# Patient Record
Sex: Female | Born: 1975
Health system: Southern US, Community
[De-identification: ages and names within clinical notes are randomized; demographics above are authoritative.]

## PROBLEM LIST (undated history)

## (undated) DIAGNOSIS — I10 Essential (primary) hypertension: Secondary | ICD-10-CM

## (undated) DIAGNOSIS — J45909 Unspecified asthma, uncomplicated: Secondary | ICD-10-CM

## (undated) DIAGNOSIS — E119 Type 2 diabetes mellitus without complications: Secondary | ICD-10-CM

---

## 2015-12-01 ENCOUNTER — Emergency Department (HOSPITAL_BASED_OUTPATIENT_CLINIC_OR_DEPARTMENT_OTHER)
Admission: EM | Admit: 2015-12-01 | Discharge: 2015-12-01 | Disposition: A | Discharge status: Home / Self Care | Payer: Medicaid Other | Attending: Emergency Medicine | Admitting: Emergency Medicine

## 2015-12-01 ENCOUNTER — Encounter (HOSPITAL_BASED_OUTPATIENT_CLINIC_OR_DEPARTMENT_OTHER): Payer: Self-pay | Admitting: Emergency Medicine

## 2015-12-01 DIAGNOSIS — I10 Essential (primary) hypertension: Secondary | ICD-10-CM | POA: Diagnosis not present

## 2015-12-01 DIAGNOSIS — Z79899 Other long term (current) drug therapy: Secondary | ICD-10-CM | POA: Insufficient documentation

## 2015-12-01 DIAGNOSIS — F17200 Nicotine dependence, unspecified, uncomplicated: Secondary | ICD-10-CM | POA: Diagnosis not present

## 2015-12-01 DIAGNOSIS — J029 Acute pharyngitis, unspecified: Secondary | ICD-10-CM | POA: Insufficient documentation

## 2015-12-01 HISTORY — DX: Essential (primary) hypertension: I10

## 2015-12-01 LAB — CBG MONITORING, ED: Glucose-Capillary: 107 mg/dL — ABNORMAL HIGH (ref 65–99)

## 2015-12-01 LAB — RAPID STREP SCREEN (MED CTR MEBANE ONLY): STREPTOCOCCUS, GROUP A SCREEN (DIRECT): NEGATIVE

## 2015-12-01 MED ORDER — AMOXICILLIN 500 MG PO CAPS: 500.0000 mg | ORAL_CAPSULE | Freq: Three times a day (TID) | ORAL | 0 refills | Status: DC

## 2015-12-01 NOTE — ED Provider Notes (Signed)
[facility] Provider Note   CSN: [id number] Arrival date & time: 12/01/15  0920     History   Chief Complaint Chief Complaint  Patient presents with  . Sore Throat    HPI Karen Cain is a 40 y.o. female.  The history is provided by the patient. No language interpreter was used.  Sore Throat  This is a new problem. The current episode started 2 days ago. The problem occurs constantly. The problem has been gradually worsening. Pertinent negatives include no shortness of breath. Nothing aggravates the symptoms. Nothing relieves the symptoms. She has tried nothing for the symptoms. The treatment provided no relief.   Pt's daughter has strep.  She had a positive strep test.  Pt complains of a sore throat.  Past Medical History:  Diagnosis Date  . Hypertension     There are no active problems to display for this patient.   History reviewed. No pertinent surgical history.  OB History    No data available       Home Medications    Prior to Admission medications   Medication Sig Start Date End Date Taking? Authorizing Provider  hydrochlorothiazide (HYDRODIURIL) 25 MG tablet Take 25 mg by mouth daily.   Yes Historical Provider, MD  amoxicillin (AMOXIL) 500 MG capsule Take 1 capsule (500 mg total) by mouth 3 (three) times daily. 12/01/15   Elson Areas, PA-C    Family History No family history on file.  Social History Social History  Substance Use Topics  . Smoking status: Current Some Day Smoker  . Smokeless tobacco: Never Used  . Alcohol use No     Allergies   Review of patient's allergies indicates no known allergies.   Review of Systems Review of Systems  Respiratory: Negative for shortness of breath.   All other systems reviewed and are negative.    Physical Exam Updated Vital Signs BP 143/88 (BP Location: Left Arm)   Pulse 83   Temp 99.1 F (37.3 C) (Oral)   Resp 18   Ht 5\' 11"  (1.803 m)   Wt 107.5 kg   SpO2 99%   BMI 33.05  kg/m   Physical Exam  Constitutional: She appears well-developed and well-nourished. No distress.  HENT:  Head: Normocephalic and atraumatic.  Erythema throat.  Tender anterior cervical   Eyes: Conjunctivae are normal.  Neck: Neck supple.  Cardiovascular: Normal rate and regular rhythm.   No murmur heard. Pulmonary/Chest: Effort normal and breath sounds normal. No respiratory distress.  Abdominal: Soft. There is no tenderness.  Musculoskeletal: She exhibits no edema.  Neurological: She is alert.  Skin: Skin is warm and dry.  Psychiatric: She has a normal mood and affect.  Nursing note and vitals reviewed.    ED Treatments / Results  Labs (all labs ordered are listed, but only abnormal results are displayed) Labs Reviewed  CBG MONITORING, ED - Abnormal; Notable for the following:       Result Value   Glucose-Capillary 107 (*)    All other components within normal limits  RAPID STREP SCREEN (NOT AT [facility])  CULTURE, GROUP A STREP [facility])    EKG  EKG Interpretation None       Radiology No results found.  Procedures Procedures (including critical care time)  Medications Ordered in ED Medications - No data to display   Initial Impression / Assessment and Plan / ED Course  I have reviewed the triage vital signs and the nursing notes.  Pertinent labs &  imaging results that were available during my care of the patient were reviewed by me and considered in my medical decision making (see chart for details).  Clinical Course    An After Visit Summary was printed and given to the patient.  Final Clinical Impressions(s) / ED Diagnoses   Final diagnoses:  Pharyngitis    New Prescriptions Discharge Medication List as of 12/01/2015 10:10 AM    START taking these medications   Details  amoxicillin (AMOXIL) 500 MG capsule Take 1 capsule (500 mg total) by mouth 3 (three) times daily., Starting Sat 12/01/2015, Print         Lonia SkinnerLeslie K New BostonSofia, PA-C 12/01/15 1050      Jacalyn LefevreJulie Haviland, MD 12/01/15 [phone]

## 2015-12-01 NOTE — ED Triage Notes (Signed)
Sore throat x 2 days, family member being treated for strep.

## 2015-12-01 NOTE — ED Notes (Signed)
PA at bedside.

## 2015-12-04 LAB — CULTURE, GROUP A STREP (THRC)

## 2016-01-29 ENCOUNTER — Emergency Department (HOSPITAL_BASED_OUTPATIENT_CLINIC_OR_DEPARTMENT_OTHER)
Admission: EM | Admit: 2016-01-29 | Discharge: 2016-01-29 | Disposition: A | Discharge status: Home / Self Care | Payer: Medicaid Other | Attending: Emergency Medicine | Admitting: Emergency Medicine

## 2016-01-29 ENCOUNTER — Emergency Department (HOSPITAL_BASED_OUTPATIENT_CLINIC_OR_DEPARTMENT_OTHER): Payer: Medicaid Other

## 2016-01-29 ENCOUNTER — Encounter (HOSPITAL_BASED_OUTPATIENT_CLINIC_OR_DEPARTMENT_OTHER): Payer: Self-pay | Admitting: *Deleted

## 2016-01-29 DIAGNOSIS — Z79899 Other long term (current) drug therapy: Secondary | ICD-10-CM | POA: Diagnosis not present

## 2016-01-29 DIAGNOSIS — F17200 Nicotine dependence, unspecified, uncomplicated: Secondary | ICD-10-CM | POA: Insufficient documentation

## 2016-01-29 DIAGNOSIS — I10 Essential (primary) hypertension: Secondary | ICD-10-CM | POA: Insufficient documentation

## 2016-01-29 DIAGNOSIS — J069 Acute upper respiratory infection, unspecified: Secondary | ICD-10-CM | POA: Diagnosis not present

## 2016-01-29 DIAGNOSIS — B9789 Other viral agents as the cause of diseases classified elsewhere: Secondary | ICD-10-CM

## 2016-01-29 DIAGNOSIS — R05 Cough: Secondary | ICD-10-CM | POA: Diagnosis present

## 2016-01-29 MED ORDER — BENZONATATE 100 MG PO CAPS: 100.0000 mg | ORAL_CAPSULE | Freq: Three times a day (TID) | ORAL | 0 refills | Status: DC | PRN

## 2016-01-29 NOTE — ED Triage Notes (Signed)
Sore throat, cough, chest and back soreness when she breathes.

## 2016-01-29 NOTE — ED Provider Notes (Signed)
[facility]   CSN: [id number] Arrival date & time: 01/29/16  1735     History   Chief Complaint Chief Complaint  Patient presents with  . Cough   HPI  Karen Cain is a 40 year old female with past medical history of hypertension who presents today with a three-day history of cough, sore throat, pleuritic chest pain. Chest pain is worse when she takes a deep breath in and it radiates to her back. She has had subjective fevers with feeling "achy". Patient states that she is the only one who is sick at home, no sick contacts. She notes that the cough has been stable since onset and nonproductive. Denies any runny nose, shortness of breath or wheezing. Denies any nausea, vomiting, diarrhea, abdominal pain. Denies any periods of immobility or hx of blood clots. Denies smoking cigarettes.    Past Medical History:  Diagnosis Date  . Hypertension     There are no active problems to display for this patient.   History reviewed. No pertinent surgical history.  OB History    No data available       Home Medications    Prior to Admission medications   Medication Sig Start Date End Date Taking? Authorizing Provider  hydrochlorothiazide (HYDRODIURIL) 25 MG tablet Take 25 mg by mouth daily.   Yes Historical Provider, MD  amoxicillin (AMOXIL) 500 MG capsule Take 1 capsule (500 mg total) by mouth 3 (three) times daily. 12/01/15   Elson AreasLeslie K Sofia, PA-C  benzonatate (TESSALON PERLES) 100 MG capsule Take 1 capsule (100 mg total) by mouth 3 (three) times daily as needed for cough. 01/29/16   Beaulah Dinninghristina M Janira Mandell, MD    Family History No family history on file.  Social History Social History  Substance Use Topics  . Smoking status: Current Some Day Smoker  . Smokeless tobacco: Never Used  . Alcohol use No     Allergies   Patient has no known allergies.   Review of Systems Review of Systems  Constitutional: Positive for fatigue and fever. Negative for  activity change, appetite change and chills.  HENT: Positive for sore throat. Negative for congestion, postnasal drip, rhinorrhea, sinus pain and sneezing.   Eyes: Negative for pain.  Respiratory: Positive for cough. Negative for chest tightness, shortness of breath and wheezing.   Cardiovascular: Positive for chest pain. Negative for palpitations.  Gastrointestinal: Negative for abdominal distention, abdominal pain, diarrhea and nausea.  Genitourinary: Negative.   Musculoskeletal: Negative for arthralgias, joint swelling and neck stiffness.  Skin: Negative for rash.  Neurological: Negative for dizziness, weakness and numbness.  Psychiatric/Behavioral: Negative.      Physical Exam Updated Vital Signs BP (!) 141/101   Pulse 90   Temp 98 F (36.7 C) (Oral)   Resp 18   Ht 5\' 11"  (1.803 m)   Wt 105.2 kg   SpO2 98%   BMI 32.36 kg/m   Physical Exam  Constitutional: She is oriented to person, place, and time. She appears well-developed and well-nourished. No distress.  HENT:  Head: Normocephalic and atraumatic.  Right Ear: External ear normal.  Left Ear: External ear normal.  Nose: Nose normal.  Mouth/Throat: Oropharynx is clear and moist.  Eyes: Conjunctivae and EOM are normal. Pupils are equal, round, and reactive to light. No scleral icterus.  Neck: Normal range of motion. Neck supple.  Cardiovascular: Normal rate, regular rhythm, normal heart sounds and intact distal pulses.   Pulmonary/Chest: Effort normal and breath sounds normal. No respiratory  distress. She has no wheezes.  Abdominal: Soft. Bowel sounds are normal. She exhibits no distension. There is no tenderness.  Musculoskeletal: Normal range of motion. She exhibits no edema or tenderness.  Lymphadenopathy:    She has no cervical adenopathy.  Neurological: She is alert and oriented to person, place, and time. She exhibits normal muscle tone.  Skin: Skin is warm and dry. Capillary refill takes less than 2 seconds.    Psychiatric: She has a normal mood and affect. Her behavior is normal. Judgment and thought content normal.    ED Treatments / Results  Labs (all labs ordered are listed, but only abnormal results are displayed) Labs Reviewed - No data to display  EKG  EKG Interpretation None       Radiology Dg Chest 2 View  Result Date: 01/29/2016 CLINICAL DATA:  Cough, chest pain, sore throat, and back soreness. EXAM: CHEST  2 VIEW COMPARISON:  None. FINDINGS: The cardiomediastinal silhouette is within normal limits. The lungs are normally to slightly hypoinflated. Minimal opacity is present in the left greater than right lung bases. No lobar consolidation, edema, pleural effusion, or pneumothorax is identified. No acute osseous abnormality is seen. IMPRESSION: Minimal left greater than right basilar opacity, likely atelectasis. Electronically Signed   By: Sebastian AcheAllen  Grady M.D.   On: 01/29/2016 18:12    Procedures Procedures (including critical care time)  Medications Ordered in ED Medications - No data to display   Initial Impression / Assessment and Plan / ED Course  I have reviewed the triage vital signs and the nursing notes.  Pertinent labs & imaging results that were available during my care of the patient were reviewed by me and considered in my medical decision making (see chart for details).  Clinical Course     Final Clinical Impressions(s) / ED Diagnoses   Final diagnoses:  Viral URI with cough  Patient is a 40 year old female with history of hypertension who presented to the ED with complaints of cough, pleuritic chest pain, and sore throat as 3 days. Upon presentation in ED patient was afebrile with stable vital signs. Of note, blood pressure slightly hypertensive at 141/101. Chest x-ray was performed and read as bilateral atelectasis, no signs of pneumona. Physical exam unremarkable for any signs of respiratory distress or wheezing. Symptoms most consistent with viral URI.  Prescription for tessalon perles provided. Patient stable for discharge.   Anders Simmondshristina Kataleyah Carducci, MD [facility], PGY-2    Beaulah Dinninghristina M Tashe Purdon, MD 01/29/16 Carlis [id number]    Melene Planan Floyd, DO 01/29/16 2002

## 2016-01-29 NOTE — Discharge Instructions (Signed)
You were seen today for a cough and chest pain. Your chest XRAY looks good and you do not have pneumonia. You likely have a viral upper respiratory infection which should get better over the course of the week. Please continue to take Tylenol as directed on the bottle. You can also try Tessalon medication, I have written you a prescription. If your symptoms become worse, please seek medical attention. Take care!

## 2016-03-06 ENCOUNTER — Encounter (HOSPITAL_BASED_OUTPATIENT_CLINIC_OR_DEPARTMENT_OTHER): Payer: Self-pay

## 2016-03-06 ENCOUNTER — Emergency Department (HOSPITAL_BASED_OUTPATIENT_CLINIC_OR_DEPARTMENT_OTHER): Payer: Medicaid Other

## 2016-03-06 ENCOUNTER — Emergency Department (HOSPITAL_BASED_OUTPATIENT_CLINIC_OR_DEPARTMENT_OTHER)
Admission: EM | Admit: 2016-03-06 | Discharge: 2016-03-06 | Disposition: A | Discharge status: Home / Self Care | Payer: Medicaid Other | Attending: Emergency Medicine | Admitting: Emergency Medicine

## 2016-03-06 DIAGNOSIS — S90212A Contusion of left great toe with damage to nail, initial encounter: Secondary | ICD-10-CM | POA: Insufficient documentation

## 2016-03-06 DIAGNOSIS — S9030XA Contusion of unspecified foot, initial encounter: Secondary | ICD-10-CM

## 2016-03-06 DIAGNOSIS — W2201XA Walked into wall, initial encounter: Secondary | ICD-10-CM | POA: Insufficient documentation

## 2016-03-06 DIAGNOSIS — Z79899 Other long term (current) drug therapy: Secondary | ICD-10-CM | POA: Diagnosis not present

## 2016-03-06 DIAGNOSIS — S99922A Unspecified injury of left foot, initial encounter: Secondary | ICD-10-CM | POA: Diagnosis present

## 2016-03-06 DIAGNOSIS — Y929 Unspecified place or not applicable: Secondary | ICD-10-CM | POA: Diagnosis not present

## 2016-03-06 DIAGNOSIS — Y9301 Activity, walking, marching and hiking: Secondary | ICD-10-CM | POA: Insufficient documentation

## 2016-03-06 DIAGNOSIS — S90129A Contusion of unspecified lesser toe(s) without damage to nail, initial encounter: Secondary | ICD-10-CM

## 2016-03-06 DIAGNOSIS — J45909 Unspecified asthma, uncomplicated: Secondary | ICD-10-CM | POA: Insufficient documentation

## 2016-03-06 DIAGNOSIS — Y999 Unspecified external cause status: Secondary | ICD-10-CM | POA: Diagnosis not present

## 2016-03-06 DIAGNOSIS — I10 Essential (primary) hypertension: Secondary | ICD-10-CM | POA: Diagnosis not present

## 2016-03-06 HISTORY — DX: Unspecified asthma, uncomplicated: J45.909

## 2016-03-06 NOTE — ED Triage Notes (Signed)
Pt reports left great toe pain after hitting it against a wall at work this morning Gillian Scarce([employer]) does not want to file workers comp at this time.

## 2016-03-06 NOTE — ED Provider Notes (Signed)
[facility] Note   CSN: [id number] Arrival date & time: 03/06/16  1726  By signing my name below, I, Emmanuella Mensah, attest that this documentation has been prepared under the direction and in the presence of Karen Mornavid Idrissa Beville, NP. Electronically Signed: Angelene GiovanniEmmanuella Mensah, ED Scribe. 03/06/16. 6:45 PM.   History   Chief Complaint Chief Complaint  Patient presents with  . Toe Injury    HPI Comments: Karen Cain is a 40 y.o. female with a hx of hypertension who presents to the Emergency Department complaining of gradually worsening moderate left great toe pain with swelling s/p injury that occurred at work this morning. She reports associated partial avulsion of the nail to that toe and pain with weight bearing. She explains that she accidentally struck her toe against the wall while ambulating with shoes on. She denies any fall, LOC, or other injuries sustained during the incident. No alleviating factors noted. Pt has not tried any medications PTA. She has NKDA. She denies any fever, chills, nausea, vomiting, open wounds, or any other symptoms.   The history is provided by the patient. No language interpreter was used.  Toe Pain  This is a new problem. The current episode started 3 to 5 hours ago. The problem has been gradually worsening. Nothing aggravates the symptoms. Nothing relieves the symptoms. She has tried nothing for the symptoms.    Past Medical History:  Diagnosis Date  . Asthma   . Hypertension     There are no active problems to display for this patient.   History reviewed. No pertinent surgical history.  OB History    No data available       Home Medications    Prior to Admission medications   Medication Sig Start Date End Date Taking? Authorizing Provider  hydrochlorothiazide (HYDRODIURIL) 25 MG tablet Take 25 mg by mouth daily.   Yes Historical Provider, MD  amoxicillin (AMOXIL) 500 MG capsule Take 1 capsule (500 mg total) by mouth  3 (three) times daily. 12/01/15   Elson AreasLeslie K Sofia, PA-C  benzonatate (TESSALON PERLES) 100 MG capsule Take 1 capsule (100 mg total) by mouth 3 (three) times daily as needed for cough. 01/29/16   Beaulah Dinninghristina M Gambino, MD    Family History History reviewed. No pertinent family history.  Social History Social History  Substance Use Topics  . Smoking status: Never Smoker  . Smokeless tobacco: Never Used  . Alcohol use No     Allergies   Patient has no known allergies.   Review of Systems Review of Systems  Constitutional: Negative for chills and fever.  Gastrointestinal: Negative for nausea and vomiting.  Musculoskeletal: Positive for arthralgias and joint swelling.  Skin: Negative for wound.  All other systems reviewed and are negative.    Physical Exam Updated Vital Signs BP 148/94 (BP Location: Left Arm)   Pulse 88   Temp 98.1 F (36.7 C) (Oral)   Resp 15   Ht 5\' 11"  (1.803 m)   Wt 220 lb (99.8 kg)   SpO2 97%   BMI 30.68 kg/m   Physical Exam  Constitutional: She is oriented to person, place, and time. She appears well-developed and well-nourished. No distress.  HENT:  Head: Normocephalic and atraumatic.  Eyes: Conjunctivae and EOM are normal.  Neck: Neck supple.  Cardiovascular: Normal rate.   Pulmonary/Chest: Effort normal. No respiratory distress.  Musculoskeletal: Normal range of motion. She exhibits tenderness.  Partially avulsed nail on the left great toe Tenderness to the lateral  aspect of the left foot   Neurological: She is alert and oriented to person, place, and time.  Skin: Skin is warm and dry.  Psychiatric: She has a normal mood and affect. Her behavior is normal.  Nursing note and vitals reviewed.    ED Treatments / Results  DIAGNOSTIC STUDIES: Oxygen Saturation is 97% on RA, normal by my interpretation.    COORDINATION OF CARE: 6:43 PM- Pt advised of plan for treatment and pt agrees. Pt will receive left great toe x-ray for further  evaluation.    Labs (all labs ordered are listed, but only abnormal results are displayed) Labs Reviewed - No data to display  EKG  EKG Interpretation None       Radiology No results found.  Procedures Procedures (including critical care time)  Medications Ordered in ED Medications - No data to display   Initial Impression / Assessment and Plan / ED Course  Karen Mornavid Rosmary Dionisio, NP has reviewed the triage vital signs and the nursing notes.  Pertinent labs & imaging results that were available during my care of the patient were reviewed by me and considered in my medical decision making (see chart for details).  Clinical Course      Patient X-Ray negative for obvious fracture or dislocation. Pain managed in ED. Pt advised to follow up with orthopedics if symptoms persist for possibility of missed fracture diagnosis. Patient given post op shoe and crutches while in ED, conservative therapy recommended and discussed. Patient will be dc home & is agreeable with above plan.   Final Clinical Impressions(s) / ED Diagnoses   Final diagnoses:  Contusion of foot including toes, initial encounter    New Prescriptions Discharge Medication List as of 03/06/2016  7:05 PM     I personally performed the services described in this documentation, which was scribed in my presence. The recorded information has been reviewed and is accurate.     Karen Mornavid Kiaira Pointer, NP 03/06/16 [id number]    Melene Planan Floyd, DO 03/10/16 1504

## 2016-05-13 ENCOUNTER — Emergency Department (HOSPITAL_BASED_OUTPATIENT_CLINIC_OR_DEPARTMENT_OTHER)
Admission: EM | Admit: 2016-05-13 | Discharge: 2016-05-13 | Disposition: A | Discharge status: Home / Self Care | Payer: Medicaid Other | Attending: Dermatology | Admitting: Dermatology

## 2016-05-13 ENCOUNTER — Encounter (HOSPITAL_BASED_OUTPATIENT_CLINIC_OR_DEPARTMENT_OTHER): Payer: Self-pay

## 2016-05-13 DIAGNOSIS — I10 Essential (primary) hypertension: Secondary | ICD-10-CM | POA: Insufficient documentation

## 2016-05-13 DIAGNOSIS — J45909 Unspecified asthma, uncomplicated: Secondary | ICD-10-CM | POA: Insufficient documentation

## 2016-05-13 DIAGNOSIS — R0981 Nasal congestion: Secondary | ICD-10-CM | POA: Diagnosis present

## 2016-05-13 DIAGNOSIS — Z5321 Procedure and treatment not carried out due to patient leaving prior to being seen by health care provider: Secondary | ICD-10-CM | POA: Diagnosis not present

## 2016-05-13 NOTE — ED Notes (Signed)
Pt was not in treatment area when PA went to evaluate.  

## 2016-05-13 NOTE — ED Triage Notes (Signed)
C/o sinus congestion and drainage-started yesterday-NAD-steady gait

## 2017-05-13 IMAGING — CR DG FOOT COMPLETE 3+V*L*
3 series · 3 of 3 positions shown · non-contrast
Comparison: None.

CLINICAL DATA: Initial evaluation for acute trauma, left great toe
pain.

EXAM:
LEFT FOOT - COMPLETE 3+ VIEW

[t foot ap left]
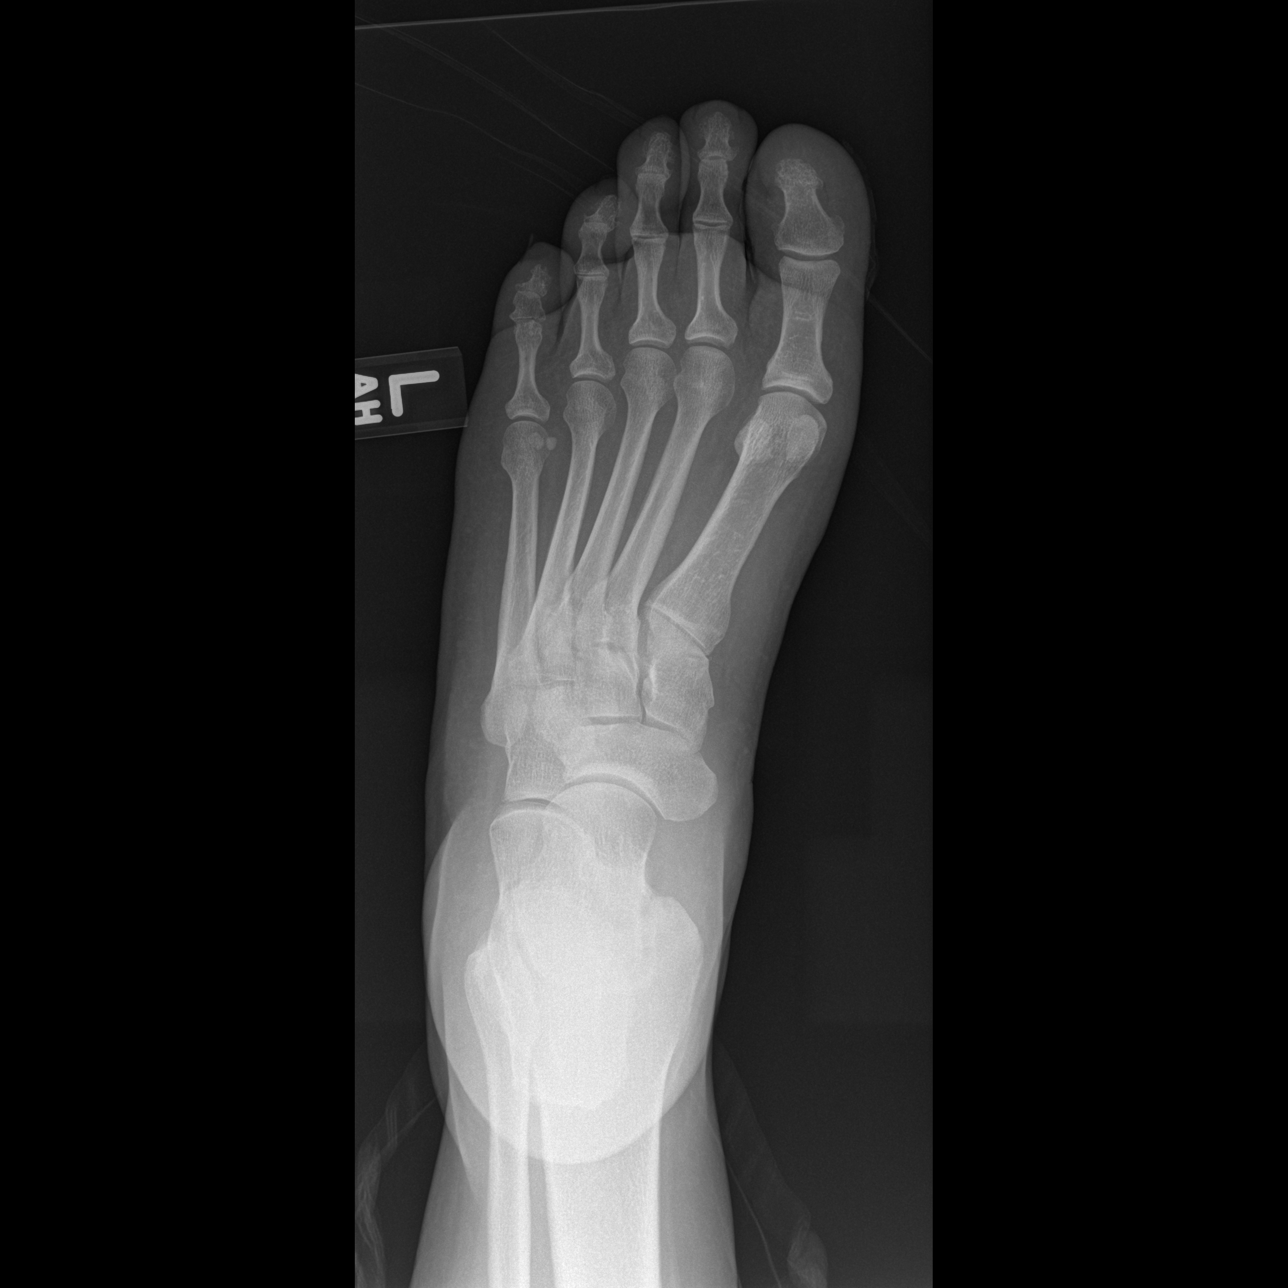

[t foot oblique left]
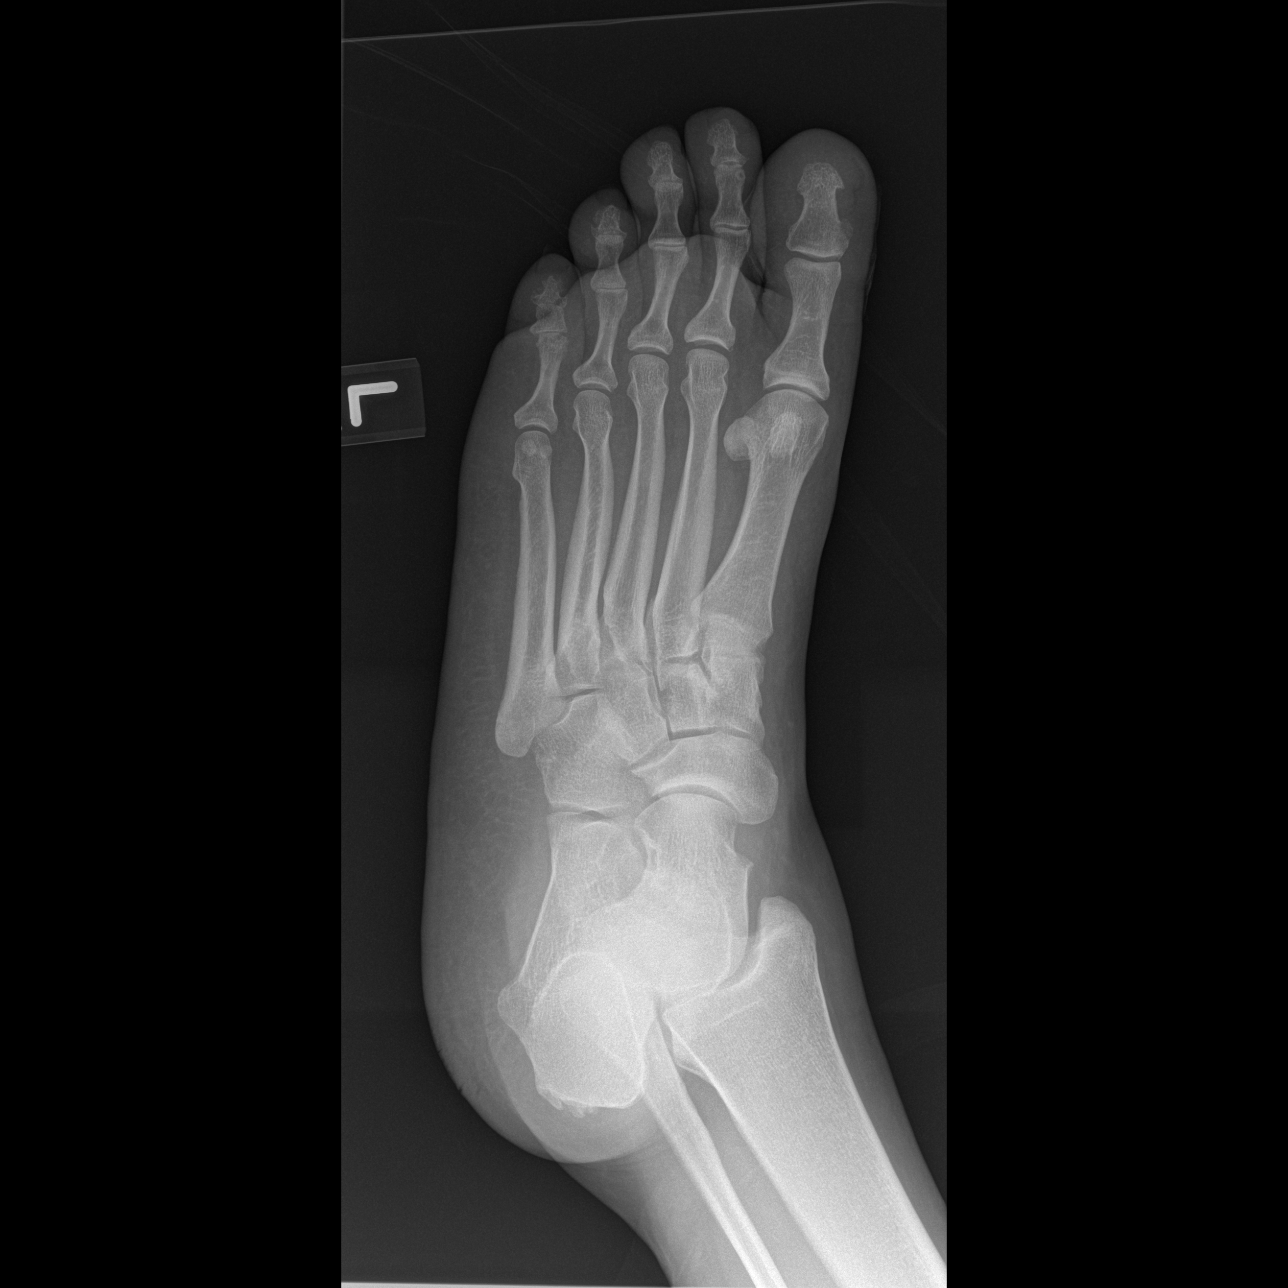

[t foot lat left]
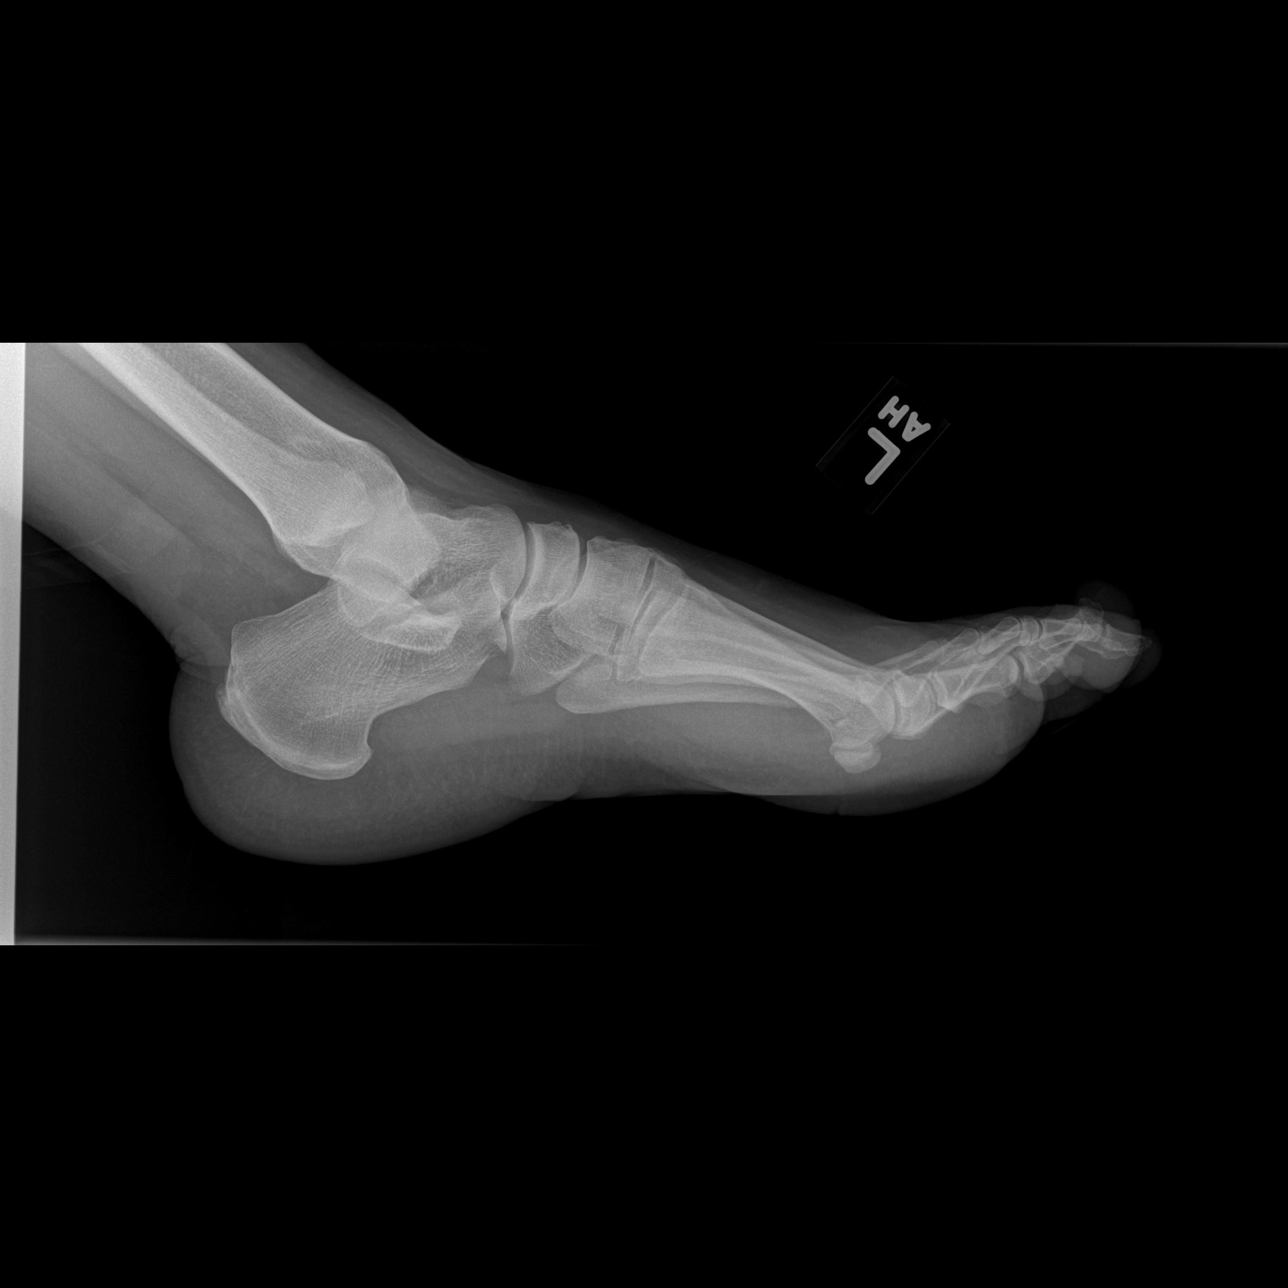

[3 of 3 positions shown; findings below may reference images not displayed]

FINDINGS: No acute fracture dislocation. Few scattered degenerative changes
noted, most evident at the left fifth PIP joint. Joint spaces
otherwise maintained. Tiny posterior and plantar calcaneal
enthesophytes noted. Degenerative spurring at the midfoot. No
definite soft tissue abnormality. Suspected bandaging material
overlies the great toe.
IMPRESSION: No acute osseous abnormality about the foot.

## 2019-01-19 ENCOUNTER — Emergency Department (HOSPITAL_BASED_OUTPATIENT_CLINIC_OR_DEPARTMENT_OTHER)
Admission: EM | Admit: 2019-01-19 | Discharge: 2019-01-19 | Disposition: A | Discharge status: Home / Self Care | Payer: Medicaid Other | Attending: Emergency Medicine | Admitting: Emergency Medicine

## 2019-01-19 ENCOUNTER — Emergency Department (HOSPITAL_BASED_OUTPATIENT_CLINIC_OR_DEPARTMENT_OTHER): Payer: Medicaid Other

## 2019-01-19 ENCOUNTER — Other Ambulatory Visit: Payer: Self-pay

## 2019-01-19 ENCOUNTER — Encounter (HOSPITAL_BASED_OUTPATIENT_CLINIC_OR_DEPARTMENT_OTHER): Payer: Self-pay | Admitting: Emergency Medicine

## 2019-01-19 DIAGNOSIS — Z79899 Other long term (current) drug therapy: Secondary | ICD-10-CM | POA: Insufficient documentation

## 2019-01-19 DIAGNOSIS — J45909 Unspecified asthma, uncomplicated: Secondary | ICD-10-CM | POA: Insufficient documentation

## 2019-01-19 DIAGNOSIS — I10 Essential (primary) hypertension: Secondary | ICD-10-CM | POA: Diagnosis not present

## 2019-01-19 DIAGNOSIS — R42 Dizziness and giddiness: Secondary | ICD-10-CM | POA: Diagnosis not present

## 2019-01-19 DIAGNOSIS — R55 Syncope and collapse: Secondary | ICD-10-CM

## 2019-01-19 DIAGNOSIS — R079 Chest pain, unspecified: Secondary | ICD-10-CM | POA: Diagnosis present

## 2019-01-19 LAB — PREGNANCY, URINE: Preg Test, Ur: NEGATIVE

## 2019-01-19 LAB — TROPONIN I (HIGH SENSITIVITY): Troponin I (High Sensitivity): <2 ng/L (ref <18)

## 2019-01-19 NOTE — ED Provider Notes (Addendum)
[facility] EMERGENCY DEPARTMENT Provider Note   CSN: [id number] Arrival date & time: 01/19/19  [id number]     History   Chief Complaint Chief Complaint  Patient presents with  . Chest Pain  . Numbness    HPI Karen Cain is a 43 y.o. female with past medical history significant for asthma and HTN that resolved with lifestyle modifications who presents to the ED with a 1 hour complaint of chest pain.  She states that around 830 this morning she became dizzy while at work.  She states that she is on her feet a lot and had been working since 5 AM.  After becoming dizzy with "tunnel vision", she went to the bathroom to throw water on her face and developed a 8 out of 10 sharp, constant chest pain that she describes as worse with inspiration.  Currently, her chest discomfort is a 5 out of 10 and the right arm tingling that she experienced with her initial onset CP has resolved.  She denies any recent illness, fevers, chills, nausea, vomiting, diaphoresis, shortness of breath, cough, or headache.  Her grandfather passed away from MI at the age of 43, but she denies any family history of familial hyperlipidemia, HOCM, or MI < 43 years old.  She denies any smoking history or hx clots.  She exercises regularly and denies any exertional chest discomfort.  She also reports that she has been increasingly anxious and stressed with her new job that she began 3 months ago.     HPI  Past Medical History:  Diagnosis Date  . Asthma   . Hypertension     There are no active problems to display for this patient.   History reviewed. No pertinent surgical history.   OB History   No obstetric history on file.      Home Medications    Prior to Admission medications   Medication Sig Start Date End Date Taking? Authorizing Provider  hydrochlorothiazide (HYDRODIURIL) 25 MG tablet Take 25 mg by mouth daily.    [provider]    Family History No family history on file.  Social  History Social History   Tobacco Use  . Smoking status: Never Smoker  . Smokeless tobacco: Never Used  Substance Use Topics  . Alcohol use: No  . Drug use: No     Allergies   Patient has no known allergies.   Review of Systems Review of Systems  All other systems reviewed and are negative.    Physical Exam Updated Vital Signs BP (!) 151/94 (BP Location: Right Arm)   Pulse 64   Temp 99.2 F (37.3 C) (Oral)   Resp 16   Ht 6' (1.829 m)   Wt 101.6 kg   SpO2 99%   BMI 30.38 kg/m   Physical Exam Vitals signs and nursing note reviewed. Exam conducted with a chaperone present.  Constitutional:      Appearance: Normal appearance. She is not diaphoretic.  HENT:     Head: Normocephalic and atraumatic.  Eyes:     General: No scleral icterus.    Extraocular Movements: Extraocular movements intact.     Conjunctiva/sclera: Conjunctivae normal.     Pupils: Pupils are equal, round, and reactive to light.  Cardiovascular:     Rate and Rhythm: Normal rate and regular rhythm.     Pulses: Normal pulses.     Heart sounds: Normal heart sounds.  Pulmonary:     Effort: Pulmonary effort is normal. No respiratory distress.  Breath sounds: Normal breath sounds. No wheezing.  Skin:    General: Skin is dry.  Neurological:     General: No focal deficit present.     Mental Status: She is alert and oriented to person, place, and time.     GCS: GCS eye subscore is 4. GCS verbal subscore is 5. GCS motor subscore is 6.     Cranial Nerves: No cranial nerve deficit.     Sensory: No sensory deficit.     Motor: No weakness.     Coordination: Coordination normal.  Psychiatric:        Mood and Affect: Mood normal.        Behavior: Behavior normal.        Thought Content: Thought content normal.      ED Treatments / Results  Labs (all labs ordered are listed, but only abnormal results are displayed) Labs Reviewed  PREGNANCY, URINE  TROPONIN I (HIGH SENSITIVITY)  TROPONIN I (HIGH  SENSITIVITY)    EKG EKG Interpretation  Date/Time:  Wednesday January 19 2019 09:34:12 EDT Ventricular Rate:  66 PR Interval:    QRS Duration: 88 QT Interval:  395 QTC Calculation: 414 R Axis:   34 Text Interpretation: Sinus arrhythmia ST elev, probable normal early repol pattern No previous ECGs available Confirmed by Vanetta Mulders [phone]) on 01/19/2019 9:40:01 AM   Radiology Dg Chest Portable 1 View  Result Date: 01/19/2019 CLINICAL DATA:  Chest pain with inspiration EXAM: PORTABLE CHEST 1 VIEW COMPARISON:  01/29/2016 FINDINGS: Normal heart size and mediastinal contours. No acute infiltrate or edema. No effusion or pneumothorax. No acute osseous findings. IMPRESSION: Negative portable chest. Electronically Signed   By: Marnee Spring M.D.   On: 01/19/2019 10:32    Procedures Procedures (including critical care time)  Medications Ordered in ED Medications - No data to display   Initial Impression / Assessment and Plan / ED Course  I have reviewed the triage vital signs and the nursing notes.  Pertinent labs & imaging results that were available during my care of the patient were reviewed by me and considered in my medical decision making (see chart for details).         Patient's report HPI is consistent with a presyncopal episode, likely vasovagal.  She acknowledges that her job requires her to stand for extended periods of time and that she developed a sense of dizziness and "tunnel vision" that precipitated her chest tightness right arm tingling.  She also reports having had increased stress and anxiety recently that she suspects may have played a role.  Her EKG was reassuring and her vitals are normal on my examination.  On reexamination, her chest pain is entirely resolved.  Her initial troponin is normal.  She does not have risk factors aside from her hypertension that she resolved with lifestyle modifications and she denies any history of exertional chest pain or  exertional syncope.  Do not suspect ACS, HOCM, Brugada syndrome, or other cardiac etiology.  She denies any history of clots, recent mobilization or surgery, and she is neither tachypneic, tachycardic, or hypoxic.  Her chest x-ray was reviewed and showed no acute cardiopulmonary findings.  I have very little suspicion for PE at this time and do not feel as though a CT is warranted.  She is PERC negative.  Return to the ED or seek medical attention should develop any new or worsening chest pain, shortness of breath, new neurologic symptoms, difficulty breathing, fainting, or any other new or  worsening symptoms.  All of the evaluation and work-up results were discussed with the patient and any family at bedside. They were provided opportunity to ask any additional questions and have none at this time. They have expressed understanding of verbal discharge instructions as well as return precautions and are agreeable to the plan.    Final Clinical Impressions(s) / ED Diagnoses   Final diagnoses:  Postural dizziness with presyncope    ED Discharge Orders    None       Corena Herter, PA-C 01/19/19 1455    Corena Herter, PA-C 01/19/19 1456    Fredia Sorrow, MD 01/27/19 [phone]

## 2019-01-19 NOTE — Discharge Instructions (Addendum)
Please get established with a primary care provider soon as possible.  Return to the ED or seek medical attention should develop any new or worsening chest pain, shortness of breath, new neurologic symptoms, difficulty breathing, fainting, or any other new or worsening symptoms.

## 2019-01-19 NOTE — ED Triage Notes (Signed)
Pt reports became dizzy at work today around 0830; this was followed by midle chest tightness and then RUE tingling; the CP and tingling have been intermittent and lessened since onset

## 2019-01-19 NOTE — ED Notes (Signed)
Labs will be delayed d/t lab renovation; pt notified.

## 2019-08-25 ENCOUNTER — Other Ambulatory Visit: Payer: Self-pay

## 2019-08-25 ENCOUNTER — Encounter (HOSPITAL_BASED_OUTPATIENT_CLINIC_OR_DEPARTMENT_OTHER): Payer: Self-pay | Admitting: Emergency Medicine

## 2019-08-25 ENCOUNTER — Emergency Department (HOSPITAL_BASED_OUTPATIENT_CLINIC_OR_DEPARTMENT_OTHER)
Admission: EM | Admit: 2019-08-25 | Discharge: 2019-08-25 | Disposition: A | Discharge status: Home / Self Care | Payer: Medicaid Other | Attending: Emergency Medicine | Admitting: Emergency Medicine

## 2019-08-25 DIAGNOSIS — M545 Low back pain, unspecified: Secondary | ICD-10-CM

## 2019-08-25 DIAGNOSIS — M62830 Muscle spasm of back: Secondary | ICD-10-CM | POA: Insufficient documentation

## 2019-08-25 DIAGNOSIS — I10 Essential (primary) hypertension: Secondary | ICD-10-CM | POA: Insufficient documentation

## 2019-08-25 DIAGNOSIS — Z79899 Other long term (current) drug therapy: Secondary | ICD-10-CM | POA: Diagnosis not present

## 2019-08-25 MED ORDER — LIDOCAINE 5 % EX PTCH: 1.0000 | MEDICATED_PATCH | CUTANEOUS | 0 refills | Status: AC

## 2019-08-25 MED ORDER — CYCLOBENZAPRINE HCL 10 MG PO TABS: 10.0000 mg | ORAL_TABLET | Freq: Two times a day (BID) | ORAL | 0 refills | Status: AC | PRN

## 2019-08-25 MED FILL — CYCLOBENZAPRINE HCL 10 MG T: 10 | 10 days supply | Qty: 20 | Fill #0

## 2019-08-25 NOTE — Discharge Instructions (Signed)
Your history and exam today are consistent with severe muscle spasms of the left low back going around towards her left hip.  It was extremely tender to the touch but there was no midline or bony tenderness.  He did not have any concerning findings such as numbness, tingling, or weakness on exam or any nerve involvement.  It seems to be related more to the muscles which she likely pulled during your work.  Please use the muscle relaxant and numbing patch to help with your symptoms and follow-up with a primary doctor.  Please rest over the next few days and stay hydrated.  You may use over-the-counter anti-inflammatory medication as well.  If any symptoms change or worsen or he develop any of the concerning finding such as numbness, weakness, or loss of control your bowel or bladder, please return to the nearest emergency department immediately.

## 2019-08-25 NOTE — ED Provider Notes (Signed)
[facility] EMERGENCY DEPARTMENT Provider Note   CSN: [id number] Arrival date & time: 08/25/19  1761     History Chief Complaint  Patient presents with  . Back Pain    Karen Cain is a 44 y.o. female.  The history is provided by the patient and medical records. No language interpreter was used.  Back Pain Location:  Lumbar spine Quality:  Aching Radiates to:  L thigh Pain severity:  Severe Pain is:  Unable to specify Onset quality:  Sudden Duration:  4 days Timing:  Constant Progression:  Waxing and waning Chronicity:  New Context: twisting   Relieved by:  Being still Worsened by:  Movement, bending and twisting Ineffective treatments:  None tried Associated symptoms: no abdominal pain, no bladder incontinence, no bowel incontinence, no chest pain, no dysuria, no fever, no headaches, no leg pain, no numbness, no pelvic pain, no perianal numbness, no tingling and no weakness        Past Medical History:  Diagnosis Date  . Asthma   . Hypertension     There are no problems to display for this patient.   History reviewed. No pertinent surgical history.   OB History   No obstetric history on file.     No family history on file.  Social History   Tobacco Use  . Smoking status: Never Smoker  . Smokeless tobacco: Never Used  Substance Use Topics  . Alcohol use: No  . Drug use: No    Home Medications Prior to Admission medications   Medication Sig Start Date End Date Taking? Authorizing Provider  hydrochlorothiazide (HYDRODIURIL) 25 MG tablet Take 25 mg by mouth daily.    [provider]    Allergies    Patient has no known allergies.  Review of Systems   Review of Systems  Constitutional: Negative for chills, diaphoresis, fatigue and fever.  HENT: Negative for congestion.   Eyes: Negative for visual disturbance.  Respiratory: Negative for cough, chest tightness, shortness of breath and wheezing.   Cardiovascular: Negative  for chest pain, palpitations and leg swelling.  Gastrointestinal: Negative for abdominal pain, bowel incontinence, constipation, diarrhea, nausea and vomiting.  Genitourinary: Negative for bladder incontinence, dysuria, flank pain, frequency and pelvic pain.  Musculoskeletal: Positive for back pain. Negative for neck pain and neck stiffness.  Neurological: Negative for tingling, weakness, light-headedness, numbness and headaches.  Psychiatric/Behavioral: Negative for agitation.  All other systems reviewed and are negative.   Physical Exam Updated Vital Signs BP (!) 149/97 (BP Location: Right Arm)   Pulse 99   Temp 99 F (37.2 C) (Oral)   Resp 20   Ht 5' 11.5" (1.816 m)   Wt 112 kg   SpO2 100%   BMI 33.97 kg/m   Physical Exam Vitals and nursing note reviewed.  Constitutional:      General: She is not in acute distress.    Appearance: She is well-developed. She is not ill-appearing, toxic-appearing or diaphoretic.  HENT:     Head: Normocephalic and atraumatic.     Right Ear: External ear normal.     Left Ear: External ear normal.     Nose: Nose normal. No congestion or rhinorrhea.     Mouth/Throat:     Mouth: Mucous membranes are moist.     Pharynx: No oropharyngeal exudate or posterior oropharyngeal erythema.  Eyes:     Conjunctiva/sclera: Conjunctivae normal.     Pupils: Pupils are equal, round, and reactive to light.  Cardiovascular:  Rate and Rhythm: Normal rate.     Pulses: Normal pulses.     Heart sounds: No murmur.  Pulmonary:     Effort: Pulmonary effort is normal. No respiratory distress.     Breath sounds: No stridor. No wheezing, rhonchi or rales.  Chest:     Chest wall: No tenderness.  Abdominal:     General: There is no distension.     Tenderness: There is no abdominal tenderness. There is no right CVA tenderness, left CVA tenderness, guarding or rebound.  Musculoskeletal:        General: Tenderness present.     Cervical back: Normal range of motion  and neck supple. No tenderness.     Lumbar back: Spasms and tenderness present. No swelling, edema, deformity, signs of trauma, lacerations or bony tenderness. Normal range of motion.       Back:     Right lower leg: No edema.     Left lower leg: No edema.  Skin:    General: Skin is warm.     Capillary Refill: Capillary refill takes less than 2 seconds.     Coloration: Skin is not pale.     Findings: No erythema or rash.  Neurological:     General: No focal deficit present.     Mental Status: She is alert and oriented to person, place, and time.     Sensory: No sensory deficit.     Motor: No weakness or abnormal muscle tone.     Deep Tendon Reflexes: Reflexes are normal and symmetric.  Psychiatric:        Mood and Affect: Mood normal.     ED Results / Procedures / Treatments   Labs (all labs ordered are listed, but only abnormal results are displayed) Labs Reviewed - No data to display  EKG None  Radiology No results found.  Procedures Procedures (including critical care time)  Medications Ordered in ED Medications - No data to display  ED Course  I have reviewed the triage vital signs and the nursing notes.  Pertinent labs & imaging results that were available during my care of the patient were reviewed by me and considered in my medical decision making (see chart for details).    MDM Rules/Calculators/A&P                      Karen Cain is a 44 y.o. female with a past medical history sniffing for asthma and hypertension who presents with left low back pain.  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  She reports no fall or trauma.  She reports the muscles worsen spasming and is been hurting ever since.  She denies any numbness, tingling, or weakness but has continued pain.  She reports has been waxing and  waning but is unable to work due to the severe spasms and pain.  She is never had this before.  She has never injured her back in the past.  She denies any loss of bowel or bladder control.  No fevers or chills.  Today she no other injuries.  No other complaints.  On exam, patient has no midline tenderness in her low back to mid back or cervical spine.  She had no numbness, tingling, weakness in the legs.  She did have visible and  palpable muscle spasms in the left low back going towards her left hip area.  It was very tender to palpation.  Abdomen was nontender.  Normal bowel sounds.  No other CVA tenderness.  Lungs clear chest nontender.  Abdomen nontender.  Patient otherwise resting comfortably when her left low back is not being palpated.  Had a long shared decision-making conversation with patient about management.  As she is not any midline tenderness, there was no direct trauma, she is not losing bowel or bladder control, and is having no numbness or tingling or weakness in the legs, we have low suspicion for a neural problem and we are more focused on a muscle spasm and muscle problem.  We agree that given her lack of any history of problems and the reassuring exam, we will hold on imaging today however we will treat the muscle spasms with muscle relaxant and Lidoderm patches.  Patient agrees with this plan and follow-up with a PCP.  If she develops any new or worsening symptoms including evidence of cord compromise or she starts having midline back pain, she agrees to come back for likely imaging and further management.  She also did not have any urinary symptoms which might suggest this being kidney related.  Low suspicion for kidney stone given the location of the pain and the palpable muscle spasm seen and felt.  Low suspicion for cauda equina, epidural abscess, or other more nefarious cause of the back pain at this time.  Patient agrees with plan of care and otherwise or concerns.  Patient  discharged in good condition with understanding of plan of care.    Final Clinical Impression(s) / ED Diagnoses Final diagnoses:  Spasm of lumbar paraspinous muscle  Acute left-sided low back pain without sciatica    Rx / DC Orders ED Discharge Orders         Ordered    cyclobenzaprine (FLEXERIL) 10 MG tablet  2 times daily PRN     08/25/19 0810    lidocaine (LIDODERM) 5 %  Every 24 hours     08/25/19 0810         Clinical Impression: 1. Spasm of lumbar paraspinous muscle   2. Acute left-sided low back pain without sciatica     Disposition: Discharge  Condition: Good  I have discussed the results, Dx and Tx plan with the pt(& family if present). He/she/they expressed understanding and agree(s) with the plan. Discharge instructions discussed at great length. Strict return precautions discussed and pt &/or family have verbalized understanding of the instructions. No further questions at time of discharge.    New Prescriptions   CYCLOBENZAPRINE (FLEXERIL) 10 MG TABLET    Take 1 tablet (10 mg total) by mouth 2 (two) times daily as needed for muscle spasms.   LIDOCAINE (LIDODERM) 5 %    Place 1 patch onto the skin daily. Remove & Discard patch within 12 hours or as directed by MD    Follow Up: [facility] [address] [place] [address] [phone] Schedule an appointment as soon as possible for a visit    [facility] [place] [id number] [place] [address] [phone]       Derren Suydam, Canary Brim, MD 08/25/19 [phone]

## 2019-08-25 NOTE — ED Triage Notes (Signed)
L low back pain radiating down L leg since Monday. No known injury.

## 2019-08-25 NOTE — ED Notes (Signed)
ED Provider at bedside. 

## 2019-09-03 ENCOUNTER — Ambulatory Visit: Payer: Medicaid Other | Attending: Internal Medicine

## 2019-09-03 DIAGNOSIS — Z23 Encounter for immunization: Secondary | ICD-10-CM

## 2019-09-03 NOTE — Progress Notes (Signed)
   Covid-19 Vaccination Clinic  Name:  Karen Cain    MRN: [id number] DOB: [birth year]  09/03/2019  Karen Cain was observed post Covid-19 immunization for 15 minutes without incident. She was provided with Vaccine Information Sheet and instruction to access the V-Safe system.   Karen Cain was instructed to call 911 with any severe reactions post vaccine: Marland Kitchen Difficulty breathing  . Swelling of face and throat  . A fast heartbeat  . A bad rash all over body  . Dizziness and weakness   Immunizations Administered    Name Date Dose VIS Date Route   Moderna COVID-19 Vaccine 09/03/2019 10:55 AM 0.5 mL 02/2019 Intramuscular   Manufacturer: Gala Murdoch   Lot: [id number]   NDC: 14388-875-79

## 2019-10-01 ENCOUNTER — Ambulatory Visit: Payer: Medicaid Other | Attending: Internal Medicine

## 2019-10-01 DIAGNOSIS — Z23 Encounter for immunization: Secondary | ICD-10-CM

## 2019-10-01 NOTE — Progress Notes (Signed)
   Covid-19 Vaccination Clinic  Name:  Ivania Teagarden    MRN: [id number] DOB: [birth year]  10/01/2019  Ms. Rayman was observed post Covid-19 immunization for 15 minutes without incident. She was provided with Vaccine Information Sheet and instruction to access the V-Safe system.   Ms. Marasco was instructed to call 911 with any severe reactions post vaccine: Marland Kitchen Difficulty breathing  . Swelling of face and throat  . A fast heartbeat  . A bad rash all over body  . Dizziness and weakness   Immunizations Administered    Name Date Dose VIS Date Route   Moderna COVID-19 Vaccine 10/01/2019 10:48 AM 0.5 mL 02/2019 Intramuscular   Manufacturer: Gala Murdoch   Lot: [id number]   NDC: 27253-664-40

## 2019-10-13 ENCOUNTER — Other Ambulatory Visit: Payer: Self-pay

## 2019-10-13 ENCOUNTER — Emergency Department (HOSPITAL_BASED_OUTPATIENT_CLINIC_OR_DEPARTMENT_OTHER)
Admission: EM | Admit: 2019-10-13 | Discharge: 2019-10-13 | Disposition: A | Discharge status: Home / Self Care | Payer: Medicaid Other | Attending: Emergency Medicine | Admitting: Emergency Medicine

## 2019-10-13 ENCOUNTER — Encounter (HOSPITAL_BASED_OUTPATIENT_CLINIC_OR_DEPARTMENT_OTHER): Payer: Self-pay | Admitting: *Deleted

## 2019-10-13 DIAGNOSIS — I10 Essential (primary) hypertension: Secondary | ICD-10-CM | POA: Insufficient documentation

## 2019-10-13 DIAGNOSIS — Z79899 Other long term (current) drug therapy: Secondary | ICD-10-CM | POA: Diagnosis not present

## 2019-10-13 DIAGNOSIS — R030 Elevated blood-pressure reading, without diagnosis of hypertension: Secondary | ICD-10-CM | POA: Diagnosis not present

## 2019-10-13 DIAGNOSIS — R110 Nausea: Secondary | ICD-10-CM

## 2019-10-13 DIAGNOSIS — J45909 Unspecified asthma, uncomplicated: Secondary | ICD-10-CM | POA: Insufficient documentation

## 2019-10-13 DIAGNOSIS — R112 Nausea with vomiting, unspecified: Secondary | ICD-10-CM | POA: Diagnosis not present

## 2019-10-13 DIAGNOSIS — B349 Viral infection, unspecified: Secondary | ICD-10-CM | POA: Diagnosis not present

## 2019-10-13 DIAGNOSIS — R509 Fever, unspecified: Secondary | ICD-10-CM | POA: Diagnosis present

## 2019-10-13 DIAGNOSIS — R519 Headache, unspecified: Secondary | ICD-10-CM | POA: Diagnosis not present

## 2019-10-13 DIAGNOSIS — Z20822 Contact with and (suspected) exposure to covid-19: Secondary | ICD-10-CM | POA: Insufficient documentation

## 2019-10-13 LAB — SARS CORONAVIRUS 2 BY RT PCR (HOSPITAL ORDER, PERFORMED IN ~~LOC~~ HOSPITAL LAB): SARS Coronavirus 2: NEGATIVE

## 2019-10-13 MED ORDER — ONDANSETRON 4 MG PO TBDP: 4.0000 mg | ORAL_TABLET | Freq: Three times a day (TID) | ORAL | 0 refills | Status: AC | PRN

## 2019-10-13 NOTE — ED Triage Notes (Signed)
Fever, vomiting, headache and fatigue since this am. She has been fully vaccinated.

## 2019-10-13 NOTE — Discharge Instructions (Addendum)
Viral Illness TREATMENT   Please use Tylenol or ibuprofen for pain.  You may use 600 mg ibuprofen every 6 hours or 1000 mg of Tylenol every 6 hours.  You may choose to alternate between the 2.  This would be most effective.  Not to exceed 4 g of Tylenol within 24 hours.  Not to exceed 3200 mg ibuprofen 24 hours.  You may always return to emergency department for reevaluation at any time. Treatment is directed at relieving symptoms. There is no cure. Antibiotics are not effective, because the infection is caused by a virus, not by bacteria. Treatment may include:  Increased fluid intake. Sports drinks offer valuable electrolytes, sugars, and fluids.  Breathing heated mist or steam (vaporizer or shower).  Eating chicken soup or other clear broths, and maintaining good nutrition.  Getting plenty of rest.  Using gargles or lozenges for comfort.  Increasing usage of your inhaler if you have asthma.  Return to work when your temperature has returned to normal.  Gargle warm salt water and spit it out for sore throat. Take benadryl to decrease sinus secretions. Continue to alternate between Tylenol and ibuprofen for pain and fever control.  Follow Up: Follow up with your primary care doctor in 5-7 days for recheck of ongoing symptoms.  Return to emergency department for emergent changing or worsening of symptoms.

## 2019-10-13 NOTE — ED Provider Notes (Signed)
[facility] EMERGENCY DEPARTMENT Provider Note   CSN: [id number] Arrival date & time: 10/13/19  1543     History Chief Complaint  Patient presents with  . Fever    Karen Cain is a 44 y.o. female.  HPI Patient is a 44 year old female with a history of asthma hypertension not currently on hypertension medications presented today for subjective fevers, some nausea 2 episodes of vomiting in her mouth was nonbloody nonbilious.  She denies any chest pain, cough, sinus congestion, denies any abdominal pain, urinary symptoms, diarrhea, constipation.  She states her headache is circumferential and mild.  She states that she symptoms gets a headache when her blood pressure is high.  She states that she has not been medications because her blood pressures been well controlled for several years.  She states that she feels overall fatigue malaise.  Dose of ibuprofen earlier today and felt somewhat better but states that she is now feeling somewhat worse.    Past Medical History:  Diagnosis Date  . Asthma   . Hypertension     There are no problems to display for this patient.   History reviewed. No pertinent surgical history.   OB History   No obstetric history on file.     No family history on file.  Social History   Tobacco Use  . Smoking status: Never Smoker  . Smokeless tobacco: Never Used  Substance Use Topics  . Alcohol use: No  . Drug use: No    Home Medications Prior to Admission medications   Medication Sig Start Date End Date Taking? Authorizing Provider  cyclobenzaprine (FLEXERIL) 10 MG tablet Take 1 tablet (10 mg total) by mouth 2 (two) times daily as needed for muscle spasms. 08/25/19   Tegeler, Canary Brim, MD  hydrochlorothiazide (HYDRODIURIL) 25 MG tablet Take 25 mg by mouth daily.    [provider]  lidocaine (LIDODERM) 5 % Place 1 patch onto the skin daily. Remove & Discard patch within 12 hours or as directed by MD 08/25/19    Tegeler, Canary Brim, MD    Allergies    Patient has no known allergies.  Review of Systems   Review of Systems  Constitutional: Positive for fatigue and fever. Negative for chills.  HENT: Negative for congestion.   Eyes: Negative for pain.  Respiratory: Negative for cough and shortness of breath.   Cardiovascular: Negative for chest pain and leg swelling.  Gastrointestinal: Positive for nausea. Negative for abdominal pain and diarrhea.  Genitourinary: Negative for dysuria.  Musculoskeletal: Negative for myalgias and neck stiffness.  Skin: Negative for rash.  Neurological: Negative for dizziness and headaches.    Physical Exam Updated Vital Signs BP (!) 158/99   Pulse 78   Temp 99.2 F (37.3 C) (Oral)   Resp 16   Ht 5' 11.5" (1.816 m)   Wt (!) 113.9 kg   SpO2 100%   BMI 34.52 kg/m   Physical Exam Vitals and nursing note reviewed.  Constitutional:      General: She is not in acute distress.    Appearance: Normal appearance. She is not ill-appearing.     Comments: Patient is afebrile 44 year old female in no acute distress and comfortably in bed.  She is well-appearing somewhat fatigued appearing.  No increased work of breathing; able answer questions appropriately follow commands.  Ambulatory without difficulty.  HENT:     Head: Normocephalic and atraumatic.     Nose: Nose normal.     Mouth/Throat:  Mouth: Mucous membranes are moist.  Eyes:     General: No scleral icterus.       Right eye: No discharge.        Left eye: No discharge.     Conjunctiva/sclera: Conjunctivae normal.  Cardiovascular:     Rate and Rhythm: Normal rate and regular rhythm.     Pulses: Normal pulses.     Heart sounds: Normal heart sounds.  Pulmonary:     Effort: Pulmonary effort is normal. No respiratory distress.     Breath sounds: Normal breath sounds. No stridor. No wheezing.     Comments: Lung sounds are normal in all fields. Abdominal:     Palpations: Abdomen is soft.      Tenderness: There is no abdominal tenderness. There is no right CVA tenderness, left CVA tenderness, guarding or rebound.  Musculoskeletal:     Cervical back: Normal range of motion.     Right lower leg: No edema.     Left lower leg: No edema.  Skin:    General: Skin is warm and dry.     Capillary Refill: Capillary refill takes less than 2 seconds.  Neurological:     Mental Status: She is alert and oriented to person, place, and time. Mental status is at baseline.  Psychiatric:        Mood and Affect: Mood normal.        Behavior: Behavior normal.     ED Results / Procedures / Treatments   Labs (all labs ordered are listed, but only abnormal results are displayed) Labs Reviewed  SARS CORONAVIRUS 2 BY RT PCR (HOSPITAL ORDER, PERFORMED IN [facility])    EKG None  Radiology No results found.  Procedures Procedures (including critical care time)  Medications Ordered in ED Medications - No data to display  ED Course  I have reviewed the triage vital signs and the nursing notes.  Pertinent labs & imaging results that were available during my care of the patient were reviewed by me and considered in my medical decision making (see chart for details).    MDM Rules/Calculators/A&P                          Patient is well-appearing 44 year old female presented today for fatigue malaise and 2 episodes of nausea this morning with some fatigue and malaise and felt hot/chills.   Pt states just two small NBNB emesis.   Physical exam is unremarkable. Lungs w/o abn. Abd soft nontender.  Offered further workup inc labwork but patient declined.  She suspects some viral illness and I agree this is likely.   Covid swab negative. She is well appearing -- vitals WNL apart from elevated BP. She will recheck with PCP. Given return precautions.   The medical records were personally reviewed by myself. I personally reviewed all lab results and interpreted all imaging  studies and either concurred with their official read or contacted radiology for clarification. Additional history obtained from old records.  This patient appears reasonably screened and I doubt any other medical condition requiring further workup, evaluation, or treatment in the ED at this time prior to discharge.   Patient's vitals are WNL apart from vital sign abnormalities discussed above, patient is in NAD, and able to ambulate in the ED at their baseline and able to tolerate PO.  Pain has been managed or a plan has been made for home management and has no complaints prior to  discharge. Patient is comfortable with above plan and for discharge at this time. All questions were answered prior to disposition. Results from the ER workup discussed with the patient face to face and all questions answered to the best of my ability. The patient is safe for discharge with strict return precautions. Patient appears safe for discharge with appropriate follow-up. Conveyed my impression with the patient and they voiced understanding and are agreeable to plan.   An After Visit Summary was printed and given to the patient.  Portions of this note were generated with Scientist, clinical (histocompatibility and immunogenetics). Dictation errors may occur despite best attempts at proofreading.     Final Clinical Impression(s) / ED Diagnoses Final diagnoses:  Nausea  Nonintractable headache, unspecified chronicity pattern, unspecified headache type  Viral illness  Elevated blood pressure reading    Rx / DC Orders ED Discharge Orders    None       Gailen Shelter, [place] 10/14/19 0341    Rolan Bucco, MD 10/14/19 [phone]

## 2019-10-14 MED FILL — ONDANSETRON ODT 4 MG TABLET: 4 | 6 days supply | Qty: 20 | Fill #0

## 2019-10-25 ENCOUNTER — Emergency Department (HOSPITAL_BASED_OUTPATIENT_CLINIC_OR_DEPARTMENT_OTHER)
Admission: EM | Admit: 2019-10-25 | Discharge: 2019-10-25 | Disposition: A | Discharge status: Home / Self Care | Payer: Medicaid Other | Attending: Emergency Medicine | Admitting: Emergency Medicine

## 2019-10-25 ENCOUNTER — Encounter (HOSPITAL_BASED_OUTPATIENT_CLINIC_OR_DEPARTMENT_OTHER): Payer: Self-pay

## 2019-10-25 ENCOUNTER — Other Ambulatory Visit: Payer: Self-pay

## 2019-10-25 DIAGNOSIS — J45909 Unspecified asthma, uncomplicated: Secondary | ICD-10-CM | POA: Insufficient documentation

## 2019-10-25 DIAGNOSIS — I10 Essential (primary) hypertension: Secondary | ICD-10-CM | POA: Insufficient documentation

## 2019-10-25 DIAGNOSIS — R519 Headache, unspecified: Secondary | ICD-10-CM | POA: Insufficient documentation

## 2019-10-25 DIAGNOSIS — Z79899 Other long term (current) drug therapy: Secondary | ICD-10-CM | POA: Insufficient documentation

## 2019-10-25 MED ORDER — DIPHENHYDRAMINE HCL 50 MG/ML IJ SOLN
25.0000 mg | Freq: Once | INTRAMUSCULAR | Status: DC
  Filled 2019-10-25: qty 1

## 2019-10-25 MED ORDER — DEXAMETHASONE 6 MG PO TABS
10.0000 mg | ORAL_TABLET | Freq: Once | ORAL | Status: AC
  Administered 2019-10-25: 10 mg via ORAL
  Filled 2019-10-25: qty 1

## 2019-10-25 MED ORDER — KETOROLAC TROMETHAMINE 60 MG/2ML IM SOLN
60.0000 mg | Freq: Once | INTRAMUSCULAR | Status: AC
  Administered 2019-10-25: 60 mg via INTRAMUSCULAR
  Filled 2019-10-25: qty 2

## 2019-10-25 MED ORDER — SODIUM CHLORIDE 0.9 % IV BOLUS: 1000.0000 mL | Freq: Once | INTRAVENOUS | Status: DC

## 2019-10-25 MED ORDER — PROCHLORPERAZINE EDISYLATE 10 MG/2ML IJ SOLN
10.0000 mg | Freq: Once | INTRAMUSCULAR | Status: DC
  Filled 2019-10-25: qty 2

## 2019-10-25 MED ORDER — DIPHENHYDRAMINE HCL 25 MG PO TABS
25.0000 mg | ORAL_TABLET | Freq: Four times a day (QID) | ORAL | 0 refills | Status: DC | PRN
Start: 2019-10-25 — End: 2019-10-25

## 2019-10-25 MED ORDER — DIPHENHYDRAMINE HCL 25 MG PO TABS: 25.0000 mg | ORAL_TABLET | Freq: Four times a day (QID) | ORAL | 0 refills | Status: AC | PRN

## 2019-10-25 MED ORDER — PROCHLORPERAZINE MALEATE 10 MG PO TABS: 10.0000 mg | ORAL_TABLET | Freq: Two times a day (BID) | ORAL | 0 refills | Status: AC | PRN

## 2019-10-25 MED FILL — PROCHLORPERAZINE 10 MG TAB: 10 | 5 days supply | Qty: 10 | Fill #0

## 2019-10-25 NOTE — ED Triage Notes (Signed)
Pt arrives with c/o headache starting yesterday states that she has been having recurrent headaches since March. Pt reports taking BP medication and headache medication with no relief.

## 2019-10-25 NOTE — Discharge Instructions (Addendum)
Please have your MRI completed and follow up with neurology. Return if you have a sudden thunderclap pain, develop numbness and weakness, vision changes, or you have headache different from you usual headache.

## 2019-10-25 NOTE — ED Provider Notes (Signed)
[facility] EMERGENCY DEPARTMENT Provider Note   CSN: [id number] Arrival date & time: 10/25/19  4163     History Chief Complaint  Patient presents with   Headache    Karen Cain is a 44 y.o. female.  Presents for right sided headache that started yesterday morning. States the pain is throbbing mostly in her right temple. Headache began gradually while getting ready for work yesterday and worsened enough to leave work early. Rested all of yesterday but still woke up with headache. She took ibuprofen and gabapentin yesterday for pain without much improvement.   Notes some associated nausea, but denies fever, chills, vomiting, sudden onset pain, weakness, dizziness, lightheadedness, syncope, neck pain,or further head injury. She has had headaches since falling and hitting the right side of her head on June 22, 2019. Headaches have been improving in frequency and duration since they started occurring and follows with [facility]. She is taking gabapentin for HA prevention and ibuprofen as needed. Typically has 1-2 headaches a week and feels this headache is pretty typical for her. She denies history of HIV infection. Has follow up MRI scheduled this week.        Past Medical History:  Diagnosis Date   Asthma    Hypertension     There are no problems to display for this patient.   History reviewed. No pertinent surgical history.   OB History   No obstetric history on file.     No family history on file.  Social History   Tobacco Use   Smoking status: Never Smoker   Smokeless tobacco: Never Used  Substance Use Topics   Alcohol use: No   Drug use: No    Home Medications Prior to Admission medications   Medication Sig Start Date End Date Taking? Authorizing Provider  cyclobenzaprine (FLEXERIL) 10 MG tablet Take 1 tablet (10 mg total) by mouth 2 (two) times daily as needed for muscle spasms. 08/25/19   Tegeler, Canary Brim, MD  diphenhydrAMINE  (BENADRYL) 25 MG tablet Take 1 tablet (25 mg total) by mouth every 6 (six) hours as needed. 10/25/19   Alvira Monday, MD  gabapentin (NEURONTIN) 100 MG capsule Take 100 mg by mouth at bedtime. 09/28/19   [provider]  hydrochlorothiazide (HYDRODIURIL) 25 MG tablet Take 25 mg by mouth daily.    [provider]  lidocaine (LIDODERM) 5 % Place 1 patch onto the skin daily. Remove & Discard patch within 12 hours or as directed by MD 08/25/19   Tegeler, Canary Brim, MD  ondansetron (ZOFRAN ODT) 4 MG disintegrating tablet Take 1 tablet (4 mg total) by mouth every 8 (eight) hours as needed for nausea or vomiting. 10/13/19   Gailen Shelter, PA  prochlorperazine (COMPAZINE) 10 MG tablet Take 1 tablet (10 mg total) by mouth 2 (two) times daily as needed for nausea or vomiting. 10/25/19   Alvira Monday, MD    Allergies    Patient has no known allergies.  Review of Systems   Review of Systems  Constitutional: Negative for chills and fever.  HENT: Negative for congestion and sore throat.   Eyes: Negative for discharge and redness.  Respiratory: Negative for cough and chest tightness.   Cardiovascular: Negative for chest pain and leg swelling.  Gastrointestinal: Negative for abdominal pain, diarrhea and nausea.  Endocrine: Negative for polydipsia, polyphagia and polyuria.  Genitourinary: Negative for dysuria, flank pain and urgency.  Musculoskeletal: Negative for arthralgias, back pain and joint swelling.  Skin: Negative for  pallor and rash.  Neurological: Positive for headaches. Negative for dizziness, tremors, seizures, syncope, facial asymmetry, speech difficulty, weakness, light-headedness and numbness.  Psychiatric/Behavioral: Negative for agitation and confusion.    Physical Exam Updated Vital Signs BP 136/87 (BP Location: Right Arm)    Pulse 64    Temp 98.9 F (37.2 C) (Oral)    Resp 16    Ht 5\' 11"  (1.803 m)    Wt 113.9 kg    SpO2 99%    BMI 35.01 kg/m   Physical  Exam Constitutional:      Appearance: She is well-developed.  HENT:     Head: Normocephalic and atraumatic.     Mouth/Throat:     Mouth: Mucous membranes are moist.     Pharynx: Oropharynx is clear.  Eyes:     General: No visual field deficit.    Extraocular Movements: Extraocular movements intact.     Pupils: Pupils are equal, round, and reactive to light.  Cardiovascular:     Rate and Rhythm: Normal rate and regular rhythm.     Heart sounds: Normal heart sounds.  Pulmonary:     Effort: Pulmonary effort is normal.     Breath sounds: Normal breath sounds.  Abdominal:     General: Bowel sounds are normal.     Palpations: Abdomen is soft.  Musculoskeletal:        General: No swelling or tenderness. Normal range of motion.     Cervical back: Normal range of motion and neck supple. No rigidity.  Skin:    General: Skin is warm and dry.     Capillary Refill: Capillary refill takes less than 2 seconds.  Neurological:     Mental Status: She is alert and oriented to person, place, and time. Mental status is at baseline.     Cranial Nerves: No cranial nerve deficit.     Sensory: No sensory deficit.     Motor: No weakness.  Psychiatric:        Mood and Affect: Mood normal.        Behavior: Behavior normal.     ED Results / Procedures / Treatments   Labs (all labs ordered are listed, but only abnormal results are displayed) Labs Reviewed - No data to display  EKG None  Radiology No results found.  Procedures Procedures (including critical care time)  Medications Ordered in ED Medications  ketorolac (TORADOL) injection 60 mg (60 mg Intramuscular Given 10/25/19 1018)  dexamethasone (DECADRON) tablet 10 mg (10 mg Oral Given 10/25/19 1017)    ED Course  I have reviewed the triage vital signs and the nursing notes.  Pertinent labs & imaging results that were available during my care of the patient were reviewed by me and considered in my medical decision making (see chart for  details).    MDM Rules/Calculators/A&P                          44 y/o female presenting with throbbing right sided headache likely related postconcussion syndrome. Patient afebrile with stable vitals and headache without alarm features such as sudden onset, change from previous headache pattern, altered mental status, signs of infection, or visual disturbance. Pain improved with dexamethasone and ketorolac. Discharged on compazine and benadryl. She has follow up MRI imaging for neurology this week. Final Clinical Impression(s) / ED Diagnoses Final diagnoses:  Acute nonintractable headache, unspecified headache type    Rx / DC Orders ED Discharge Orders  Ordered    prochlorperazine (COMPAZINE) 10 MG tablet  2 times daily PRN     Discontinue  Reprint     10/25/19 1011    diphenhydrAMINE (BENADRYL) 25 MG tablet  Every 6 hours PRN,   Status:  Discontinued     Reprint     10/25/19 1011    diphenhydrAMINE (BENADRYL) 25 MG tablet  Every 6 hours PRN     Discontinue  Reprint     10/25/19 1013           Quincy Simmonds, MD 10/25/19 2049    Alvira Monday, MD 10/25/19 2234

## 2021-02-10 ENCOUNTER — Encounter (HOSPITAL_BASED_OUTPATIENT_CLINIC_OR_DEPARTMENT_OTHER): Payer: Self-pay

## 2021-02-10 ENCOUNTER — Other Ambulatory Visit: Payer: Self-pay

## 2021-02-10 ENCOUNTER — Emergency Department (HOSPITAL_BASED_OUTPATIENT_CLINIC_OR_DEPARTMENT_OTHER)
Admission: EM | Admit: 2021-02-10 | Discharge: 2021-02-10 | Disposition: A | Discharge status: Home / Self Care | Payer: Medicaid Other | Attending: Emergency Medicine | Admitting: Emergency Medicine

## 2021-02-10 DIAGNOSIS — E119 Type 2 diabetes mellitus without complications: Secondary | ICD-10-CM | POA: Insufficient documentation

## 2021-02-10 DIAGNOSIS — I10 Essential (primary) hypertension: Secondary | ICD-10-CM | POA: Insufficient documentation

## 2021-02-10 DIAGNOSIS — R519 Headache, unspecified: Secondary | ICD-10-CM | POA: Diagnosis present

## 2021-02-10 DIAGNOSIS — J45909 Unspecified asthma, uncomplicated: Secondary | ICD-10-CM | POA: Insufficient documentation

## 2021-02-10 DIAGNOSIS — Z79899 Other long term (current) drug therapy: Secondary | ICD-10-CM | POA: Diagnosis not present

## 2021-02-10 HISTORY — DX: Type 2 diabetes mellitus without complications: E11.9

## 2021-02-10 LAB — CBC WITH DIFFERENTIAL/PLATELET
Abs Immature Granulocytes: 0.02 10*3/uL (ref 0.00–0.07)
Basophils Absolute: 0 10*3/uL (ref 0.0–0.1)
Basophils Relative: 0 %
Eosinophils Absolute: 0.1 10*3/uL (ref 0.0–0.5)
Eosinophils Relative: 3 %
HCT: 37.7 % (ref 36.0–46.0)
Hemoglobin: 12.2 g/dL (ref 12.0–15.0)
Immature Granulocytes: 0 %
Lymphocytes Relative: 36 %
Lymphs Abs: 1.6 10*3/uL (ref 0.7–4.0)
MCH: 27.1 pg (ref 26.0–34.0)
MCHC: 32.4 g/dL (ref 30.0–36.0)
MCV: 83.8 fL (ref 80.0–100.0)
Monocytes Absolute: 0.4 10*3/uL (ref 0.1–1.0)
Monocytes Relative: 9 %
Neutro Abs: 2.3 10*3/uL (ref 1.7–7.7)
Neutrophils Relative %: 52 %
Platelets: 187 10*3/uL (ref 150–400)
RBC: 4.5 MIL/uL (ref 3.87–5.11)
RDW: 13 % (ref 11.5–15.5)
WBC: 4.5 10*3/uL (ref 4.0–10.5)
nRBC: 0 % (ref 0.0–0.2)

## 2021-02-10 LAB — BASIC METABOLIC PANEL
Anion gap: 7 (ref 5–15)
BUN: 13 mg/dL (ref 6–20)
CO2: 23 mmol/L (ref 22–32)
Calcium: 9.1 mg/dL (ref 8.9–10.3)
Chloride: 108 mmol/L (ref 98–111)
Creatinine, Ser: 0.93 mg/dL (ref 0.44–1.00)
GFR, Estimated: >60 mL/min (ref >60)
Glucose, Bld: 102 mg/dL — ABNORMAL HIGH (ref 70–99)
Potassium: 3.7 mmol/L (ref 3.5–5.1)
Sodium: 138 mmol/L (ref 135–145)

## 2021-02-10 MED ORDER — SODIUM CHLORIDE 0.9 % IV BOLUS
500.0000 mL | Freq: Once | INTRAVENOUS | Status: AC
  Administered 2021-02-10: 500 mL via INTRAVENOUS

## 2021-02-10 MED ORDER — KETOROLAC TROMETHAMINE 30 MG/ML IJ SOLN
30.0000 mg | Freq: Once | INTRAMUSCULAR | Status: AC
  Administered 2021-02-10: 30 mg via INTRAVENOUS
  Filled 2021-02-10: qty 1

## 2021-02-10 MED ORDER — METOCLOPRAMIDE HCL 5 MG/ML IJ SOLN
10.0000 mg | Freq: Once | INTRAMUSCULAR | Status: AC
  Administered 2021-02-10: 10 mg via INTRAVENOUS
  Filled 2021-02-10: qty 2

## 2021-02-10 NOTE — ED Notes (Signed)
MD at bedside assessing pt at this time.

## 2021-02-10 NOTE — Discharge Instructions (Addendum)
You were seen in the emergency department for evaluation of a headache.  You had lab work done and received medication that helped her headache.  Please rest and drink plenty of fluids.  Tylenol and ibuprofen for pain.  Continue your regular medications.  Follow-up with your primary care doctor.  Return to the emergency department if any worsening or concerning symptoms.

## 2021-02-10 NOTE — ED Provider Notes (Signed)
[facility] EMERGENCY DEPARTMENT Provider Note   CSN: [id number] Arrival date & time: 02/10/21  [id number]     History Chief Complaint  Patient presents with   Migraine    Karen Cain is a 45 y.o. female.  She is here with a complaint of headache for the last 3 days.  History of same.  Associated with some spots in her vision and nausea.  No vomiting.  No fever.  She recently saw her primary care doctor and started medication for blood pressure and diabetes.  She has had headaches on and off for over a year.  Has not tried anything for it.   Migraine This is a recurrent problem. The current episode started more than 2 days ago. The problem occurs constantly. The problem has not changed since onset.Associated symptoms include headaches. Pertinent negatives include no chest pain, no abdominal pain and no shortness of breath. Nothing aggravates the symptoms. Nothing relieves the symptoms. She has tried rest for the symptoms. The treatment provided no relief.      Past Medical History:  Diagnosis Date   Asthma    Diabetes mellitus without complication (HCC)    Hypertension     There are no problems to display for this patient.   History reviewed. No pertinent surgical history.   OB History   No obstetric history on file.     No family history on file.  Social History   Tobacco Use   Smoking status: Never   Smokeless tobacco: Never  Substance Use Topics   Alcohol use: No   Drug use: No    Home Medications Prior to Admission medications   Medication Sig Start Date End Date Taking? Authorizing Provider  cyclobenzaprine (FLEXERIL) 10 MG tablet Take 1 tablet (10 mg total) by mouth 2 (two) times daily as needed for muscle spasms. 08/25/19   Tegeler, Canary Brimhristopher J, MD  diphenhydrAMINE (BENADRYL) 25 MG tablet Take 1 tablet (25 mg total) by mouth every 6 (six) hours as needed. 10/25/19   Alvira MondaySchlossman, Erin, MD  gabapentin (NEURONTIN) 100 MG capsule Take 100 mg by mouth  at bedtime. 09/28/19   [provider]  hydrochlorothiazide (HYDRODIURIL) 25 MG tablet Take 25 mg by mouth daily.    [provider]  lidocaine (LIDODERM) 5 % Place 1 patch onto the skin daily. Remove & Discard patch within 12 hours or as directed by MD 08/25/19   Tegeler, Canary Brimhristopher J, MD  ondansetron (ZOFRAN ODT) 4 MG disintegrating tablet Take 1 tablet (4 mg total) by mouth every 8 (eight) hours as needed for nausea or vomiting. 10/13/19   Gailen ShelterFondaw, Wylder S, PA  prochlorperazine (COMPAZINE) 10 MG tablet Take 1 tablet (10 mg total) by mouth 2 (two) times daily as needed for nausea or vomiting. 10/25/19   Alvira MondaySchlossman, Erin, MD    Allergies    Patient has no known allergies.  Review of Systems   Review of Systems  Constitutional:  Negative for fever.  HENT:  Negative for sore throat.   Eyes:  Positive for visual disturbance.  Respiratory:  Negative for shortness of breath.   Cardiovascular:  Negative for chest pain.  Gastrointestinal:  Negative for abdominal pain.  Genitourinary:  Negative for dysuria.  Musculoskeletal:  Negative for neck pain.  Skin:  Negative for rash.  Neurological:  Positive for headaches.   Physical Exam Updated Vital Signs BP (!) 167/119 (BP Location: Right Arm)   Pulse 85   Temp 98.6 F (37 C) (Oral)  Resp 16   Ht 5\' 11"  (1.803 m)   Wt 110.2 kg   LMP  (LMP Unknown)   SpO2 100%   BMI 33.89 kg/m   Physical Exam Vitals and nursing note reviewed.  Constitutional:      General: She is not in acute distress.    Appearance: Normal appearance. She is well-developed.  HENT:     Head: Normocephalic and atraumatic.  Eyes:     Conjunctiva/sclera: Conjunctivae normal.  Cardiovascular:     Rate and Rhythm: Normal rate and regular rhythm.     Heart sounds: No murmur heard. Pulmonary:     Effort: Pulmonary effort is normal. No respiratory distress.     Breath sounds: Normal breath sounds.  Abdominal:     Palpations: Abdomen is soft.      Tenderness: There is no abdominal tenderness.  Musculoskeletal:        General: No swelling. Normal range of motion.     Cervical back: Neck supple.     Right lower leg: No edema.     Left lower leg: No edema.  Skin:    General: Skin is warm and dry.     Capillary Refill: Capillary refill takes less than 2 seconds.  Neurological:     General: No focal deficit present.     Mental Status: She is alert and oriented to person, place, and time.     Cranial Nerves: No cranial nerve deficit.     Sensory: No sensory deficit.     Motor: No weakness.     Gait: Gait normal.  Psychiatric:        Mood and Affect: Mood normal.    ED Results / Procedures / Treatments   Labs (all labs ordered are listed, but only abnormal results are displayed) Labs Reviewed  BASIC METABOLIC PANEL - Abnormal; Notable for the following components:      Result Value   Glucose, Bld 102 (*)    All other components within normal limits  CBC WITH DIFFERENTIAL/PLATELET    EKG None  Radiology No results found.  Procedures Procedures   Medications Ordered in ED Medications  sodium chloride 0.9 % bolus 500 mL (has no administration in time range)  metoCLOPramide (REGLAN) injection 10 mg (has no administration in time range)  ketorolac (TORADOL) 30 MG/ML injection 30 mg (has no administration in time range)    ED Course  I have reviewed the triage vital signs and the nursing notes.  Pertinent labs & imaging results that were available during my care of the patient were reviewed by me and considered in my medical decision making (see chart for details).  Clinical Course as of 02/10/21 1736  Sun Feb 10, 2021  Feb 12, 2021 Patient states her headache is improved.  Labs reassuring.  Nonfocal neuro exam do not feel she needs imaging at this time.  Blood pressure also trending down. [MB]    Clinical Course User Index [MB] 5885, MD   MDM Rules/Calculators/A&P                          This patient  complains of headache and elevated blood pressure; this involves an extensive number of treatment Options and is a complaint that carries with it a high risk of complications and Morbidity. The differential includes hypertensive emergency, hypertensive urgency, migraine headache, generalized headache with elevated blood pressure  I ordered, reviewed and interpreted labs, which included CBC with normal white count  normal hemoglobin, chemistries with normal renal function I ordered medication IV migraine cocktail with improvement in her symptoms Previous records obtained and reviewed in epic no recent admissions  After the interventions stated above, I reevaluated the patient and found patient's headache to be much improved and her blood pressure is also trending down.  Recommended continuing her regular medications and following up with her primary care doctor.  Return instructions discussed   Final Clinical Impression(s) / ED Diagnoses Final diagnoses:  Generalized headache  Essential hypertension    Rx / DC Orders ED Discharge Orders     None        Hayden Rasmussen, MD 02/10/21 [phone]

## 2021-02-10 NOTE — ED Triage Notes (Signed)
Pt presents to ED from home C/O headache since Thursday. Pt reports hx migraines.

## 2021-02-10 NOTE — ED Notes (Signed)
Pt ambulated to BR with steady gait.

## 2021-05-25 ENCOUNTER — Other Ambulatory Visit: Payer: Self-pay

## 2021-05-25 ENCOUNTER — Emergency Department (HOSPITAL_BASED_OUTPATIENT_CLINIC_OR_DEPARTMENT_OTHER): Payer: Medicaid Other

## 2021-05-25 ENCOUNTER — Encounter (HOSPITAL_BASED_OUTPATIENT_CLINIC_OR_DEPARTMENT_OTHER): Payer: Self-pay | Admitting: Emergency Medicine

## 2021-05-25 ENCOUNTER — Emergency Department (HOSPITAL_BASED_OUTPATIENT_CLINIC_OR_DEPARTMENT_OTHER)
Admission: EM | Admit: 2021-05-25 | Discharge: 2021-05-26 | Disposition: A | Discharge status: Home / Self Care | Payer: Medicaid Other | Attending: Emergency Medicine | Admitting: Emergency Medicine

## 2021-05-25 DIAGNOSIS — J45909 Unspecified asthma, uncomplicated: Secondary | ICD-10-CM | POA: Diagnosis not present

## 2021-05-25 DIAGNOSIS — E119 Type 2 diabetes mellitus without complications: Secondary | ICD-10-CM | POA: Insufficient documentation

## 2021-05-25 DIAGNOSIS — I10 Essential (primary) hypertension: Secondary | ICD-10-CM | POA: Diagnosis not present

## 2021-05-25 DIAGNOSIS — R072 Precordial pain: Secondary | ICD-10-CM | POA: Diagnosis not present

## 2021-05-25 DIAGNOSIS — Z79899 Other long term (current) drug therapy: Secondary | ICD-10-CM | POA: Diagnosis not present

## 2021-05-25 DIAGNOSIS — E876 Hypokalemia: Secondary | ICD-10-CM | POA: Diagnosis not present

## 2021-05-25 DIAGNOSIS — R945 Abnormal results of liver function studies: Secondary | ICD-10-CM | POA: Insufficient documentation

## 2021-05-25 DIAGNOSIS — R079 Chest pain, unspecified: Secondary | ICD-10-CM

## 2021-05-25 LAB — BASIC METABOLIC PANEL
Anion gap: 9 (ref 5–15)
BUN: 14 mg/dL (ref 6–20)
CO2: 22 mmol/L (ref 22–32)
Calcium: 9.1 mg/dL (ref 8.9–10.3)
Chloride: 108 mmol/L (ref 98–111)
Creatinine, Ser: 0.91 mg/dL (ref 0.44–1.00)
GFR, Estimated: >60 mL/min (ref >60)
Glucose, Bld: 99 mg/dL (ref 70–99)
Potassium: 3.4 mmol/L — ABNORMAL LOW (ref 3.5–5.1)
Sodium: 139 mmol/L (ref 135–145)

## 2021-05-25 LAB — CBC
HCT: 37.7 % (ref 36.0–46.0)
Hemoglobin: 12.1 g/dL (ref 12.0–15.0)
MCH: 27.1 pg (ref 26.0–34.0)
MCHC: 32.1 g/dL (ref 30.0–36.0)
MCV: 84.3 fL (ref 80.0–100.0)
Platelets: 216 10*3/uL (ref 150–400)
RBC: 4.47 MIL/uL (ref 3.87–5.11)
RDW: 13.3 % (ref 11.5–15.5)
WBC: 5.6 10*3/uL (ref 4.0–10.5)
nRBC: 0 % (ref 0.0–0.2)

## 2021-05-25 LAB — TROPONIN I (HIGH SENSITIVITY)
Troponin I (High Sensitivity): 2 ng/L (ref <18)
Troponin I (High Sensitivity): 2 ng/L (ref <18)

## 2021-05-25 LAB — HEPATIC FUNCTION PANEL
ALT: 13 U/L (ref 0–44)
AST: 13 U/L — ABNORMAL LOW (ref 15–41)
Albumin: 3.9 g/dL (ref 3.5–5.0)
Alkaline Phosphatase: 45 U/L (ref 38–126)
Bilirubin, Direct: 0.1 mg/dL (ref 0.0–0.2)
Indirect Bilirubin: 0.4 mg/dL (ref 0.3–0.9)
Total Bilirubin: 0.5 mg/dL (ref 0.3–1.2)
Total Protein: 7.1 g/dL (ref 6.5–8.1)

## 2021-05-25 LAB — LIPASE, BLOOD: Lipase: 34 U/L (ref 11–51)

## 2021-05-25 NOTE — Discharge Instructions (Addendum)
You were seen here today for evaluation of your chest pain and right sided rib pain. Your lab work was normal other than slightly low potassium. Your EKG was unchanged from your previous and your chest x-ray was clear. This chest pain is likely due to your stress and anxiety at work. I have given you a few days off. Please use this time to rest and relax. If your symptoms return, you start having more chest pain, SOB, lightheadedness, or fainting, please return to the nearest ER for evaluation.  ? ?Contact a doctor if: ?Your chest pain does not go away. ?You feel depressed. ?You have a fever. ?Get help right away if: ?Your chest pain is worse. ?You have a cough that gets worse, or you cough up blood. ?You have very bad (severe) pain in your belly (abdomen). ?You pass out (faint). ?You have either of these for no clear reason: ?Sudden chest discomfort. ?Sudden discomfort in your arms, back, neck, or jaw. ?You have shortness of breath at any time. ?You suddenly start to sweat, or your skin gets clammy. ?You feel sick to your stomach (nauseous). ?You throw up (vomit). ?You suddenly feel lightheaded or dizzy. ?You feel very weak or tired. ?Your heart starts to beat fast, or it feels like it is skipping beats. ?

## 2021-05-25 NOTE — ED Triage Notes (Signed)
Pt arrives pov, ambulatory to triage with c/o midsternal CP radiating to right upper back starting this morning. Also c/o Right hip pain. Denies n/v, reports pain with deep inspiration ?

## 2021-05-25 NOTE — ED Provider Notes (Signed)
[facility] Provider Note   CSN: [id number] Arrival date & time: 05/25/21  1805     History Chief Complaint  Patient presents with   Chest Pain    Karen Cain is a 46 y.o. female with history of asthma, hypertension, and borderline diabetes presents to the emergency [facility] for evaluation of chest pain onset 050 0-2 100 today.  She reports the chest pain is sternal and has pain on inspiration.  Denies any radiation of the pain.  She denies any shortness of breath, nausea, vomiting, diarrhea, constipation, diaphoresis, dysuria, hematuria, fever, cough, or any abdominal pain.  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  She reports that she thinks is where her chest pain is coming from, but wanted to get checked out.  Denies any tobacco, EtOH, also drug use ever.   Nursing note says the patient complains of right hip pain although she did not complain about this with me.  Chest Pain     Home Medications Prior to Admission medications   Medication Sig Start Date End Date Taking? Authorizing Provider  cyclobenzaprine (FLEXERIL) 10 MG tablet Take 1 tablet (10 mg total) by mouth 2 (two) times daily as needed for muscle spasms. 08/25/19   Tegeler, Gwenyth Allegra, MD  diphenhydrAMINE (BENADRYL) 25 MG tablet Take 1 tablet (25 mg total) by mouth every 6 (six) hours as needed. 10/25/19   Gareth Morgan, MD  gabapentin (NEURONTIN) 100 MG capsule Take 100 mg by mouth at bedtime. 09/28/19   [provider]  hydrochlorothiazide (HYDRODIURIL) 25 MG tablet Take 25 mg by mouth daily.    [provider]  lidocaine (LIDODERM) 5 % Place 1 patch onto the skin daily. Remove & Discard patch within 12 hours or as directed by MD 08/25/19   Tegeler, Gwenyth Allegra, MD  ondansetron (ZOFRAN ODT) 4 MG disintegrating tablet Take 1 tablet (4 mg total) by mouth every 8 (eight) hours as needed for  nausea or vomiting. 10/13/19   Tedd Sias, PA  prochlorperazine (COMPAZINE) 10 MG tablet Take 1 tablet (10 mg total) by mouth 2 (two) times daily as needed for nausea or vomiting. 10/25/19   Gareth Morgan, MD      Allergies    Patient has no known allergies.    Review of Systems   Review of Systems  Cardiovascular:  Positive for chest pain.   SEE HPI  Physical Exam Updated Vital Signs BP (!) 146/97    Pulse 72    Temp 98.2 F (36.8 C) (Oral)    Resp 14    Ht 5\' 11"  (1.803 m)    Wt 105.2 kg    SpO2 99%    BMI 32.36 kg/m  Physical Exam Constitutional:      General: She is not in acute distress.    Appearance: Normal appearance. She is not toxic-appearing or diaphoretic.  HENT:     Head: Normocephalic and atraumatic.  Eyes:     General: No scleral icterus. Cardiovascular:     Rate and Rhythm: Normal rate and regular rhythm.     Pulses:          Carotid pulses are 2+ on the right side and 2+ on the left side.      Radial pulses are 2+ on the right side and 2+ on the left side.       Dorsalis pedis pulses are  2+ on the right side and 2+ on the left side.       Posterior tibial pulses are 2+ on the right side and 2+ on the left side.     Heart sounds: Normal heart sounds.  Pulmonary:     Effort: Pulmonary effort is normal.     Breath sounds: Normal breath sounds. No decreased breath sounds or wheezing.     Comments: Clear to auscultation bilaterally.  No respiratory distress, assessor muscle use, tripoding, nasal flaring, or cyanosis present.  Patient is speaking in full sentence with ease.  Satting well on room air without any increased work of breathing. Chest:     Chest wall: No tenderness.  Abdominal:     General: Abdomen is flat. Bowel sounds are normal.     Palpations: Abdomen is soft.     Tenderness: There is no abdominal tenderness. There is no guarding or rebound.  Musculoskeletal:        General: No deformity.     Cervical back: Normal range of motion.      Right lower leg: No tenderness. No edema.     Left lower leg: No tenderness. No edema.  Skin:    General: Skin is warm and dry.     Capillary Refill: Capillary refill takes less than 2 seconds.  Neurological:     General: No focal deficit present.     Mental Status: She is alert. Mental status is at baseline.    ED Results / Procedures / Treatments   Labs (all labs ordered are listed, but only abnormal results are displayed) Labs Reviewed  BASIC METABOLIC PANEL - Abnormal; Notable for the following components:      Result Value   Potassium 3.4 (*)    All other components within normal limits  HEPATIC FUNCTION PANEL - Abnormal; Notable for the following components:   AST 13 (*)    All other components within normal limits  CBC  LIPASE, BLOOD  TROPONIN I (HIGH SENSITIVITY)  TROPONIN I (HIGH SENSITIVITY)    EKG EKG Interpretation  Date/Time:  Saturday May 25 2021 18:13:35 EST Ventricular Rate:  100 PR Interval:  176 QRS Duration: 86 QT Interval:  342 QTC Calculation: 441 R Axis:   42 Text Interpretation: Normal sinus rhythm Cannot rule out Anterior infarct , age undetermined Abnormal ECG No significant change since last tracing Confirmed by Deno Etienne [phone]) on 05/25/2021 9:32:59 PM  Radiology DG Chest 2 View  Result Date: 05/25/2021 CLINICAL DATA:  Chest pain. EXAM: CHEST - 2 VIEW COMPARISON:  January 19, 2019 FINDINGS: The heart size and mediastinal contours are within normal limits. Both lungs are clear. The visualized skeletal structures are unremarkable. IMPRESSION: No active cardiopulmonary disease. Electronically Signed   By: Virgina Norfolk M.D.   On: 05/25/2021 18:47    Procedures Procedures   Medications Ordered in ED Medications - No data to display  ED Course/ Medical Decision Making/ A&P                           Medical Decision Making Amount and/or Complexity of Data Reviewed Labs: ordered. Radiology: ordered.   46 year old female presents  emergency [facility] for evaluation of chest pain that is since resolved.  Differential diagnosis includes but is not limited to ACS, pneumonia, pneumothorax, dissection, PE, GERD, costochondritis, anxiety.  Vital signs show elevated blood pressure at 149/98.  Patient normal pulse, normal temperature, satting well on room air without any  increased work of breathing.  Physical exam is unremarkable.  Patient is well-appearing in no acute distress.  She has clear lung sounds bilaterally.  Equal pulses throughout.  No tenderness to the chest.  Her legs are nontender and nonedematous.  Abdomen nontender without any guarding or rebound.  Normal active bowel sounds.  We will order labs and chest x-ray.  I independently reviewed and interpreted the patient's labs and imaging and agree with the radiologist interpretation.  BMP shows slightly decreased potassium at 3.4 otherwise no electrolyte abnormalities.  Hepatic panel shows slightly decreased AST at 13 otherwise normal.  CBC unremarkable.  Lipase normal.  Troponin at 2 with a repeat of 2, delta 0.  Chest x-ray shows clear lungs and no active cardiopulmonary process.  No signs of pneumonia or pneumothorax.  EKG shows normal sinus rhythm and no significant change since last tracing. HEAR score of 2.  Upon looking at the review of vitals, the patient has 1 setting of tachypnea at 28 although I think this was missed charted as the patient denies any shortness of breath or any increased work of breathing.,  Multiple reevaluations, the patient has been well-appearing without any tachypnea.  At this time, I doubt any ACS given unchanged EKG and flat troponins.  I doubt any pneumonia or pneumothorax given chest x-ray.  This does not sound like a typical dissection as the patient's pain has resolved and she is sitting comfortably.  Palpable pulses throughout that are equal.  Patient is PERC negative and low Wells criteria.  Doubt PE.  Given the reassuring labs and  imaging, I cannot find a source of the patient's chest pain although likely due to stress and anxiety.  Discussed the results with patient.  Recommended lowering her stress.  Offered her a few days off of work to rest and recuperate.  Recommended following with the with a PCP for stress reducing tips or possible medication.  Strict return precautions given.  Patient agrees with plan.  Patient is stable being discharged home in good condition. Final Clinical Impression(s) / ED Diagnoses Final diagnoses:  Chest pain, unspecified type    Rx / DC Orders ED Discharge Orders     None         Sherrell Puller, PA-C 05/30/21 [place], [place], DO 05/31/21 [phone]

## 2022-08-01 IMAGING — CR DG CHEST 2V
2 series · 2 of 2 positions shown · non-contrast
Comparison: January 19, 2019

CLINICAL DATA: Chest pain.

EXAM:
CHEST - 2 VIEW

[w chest pa]
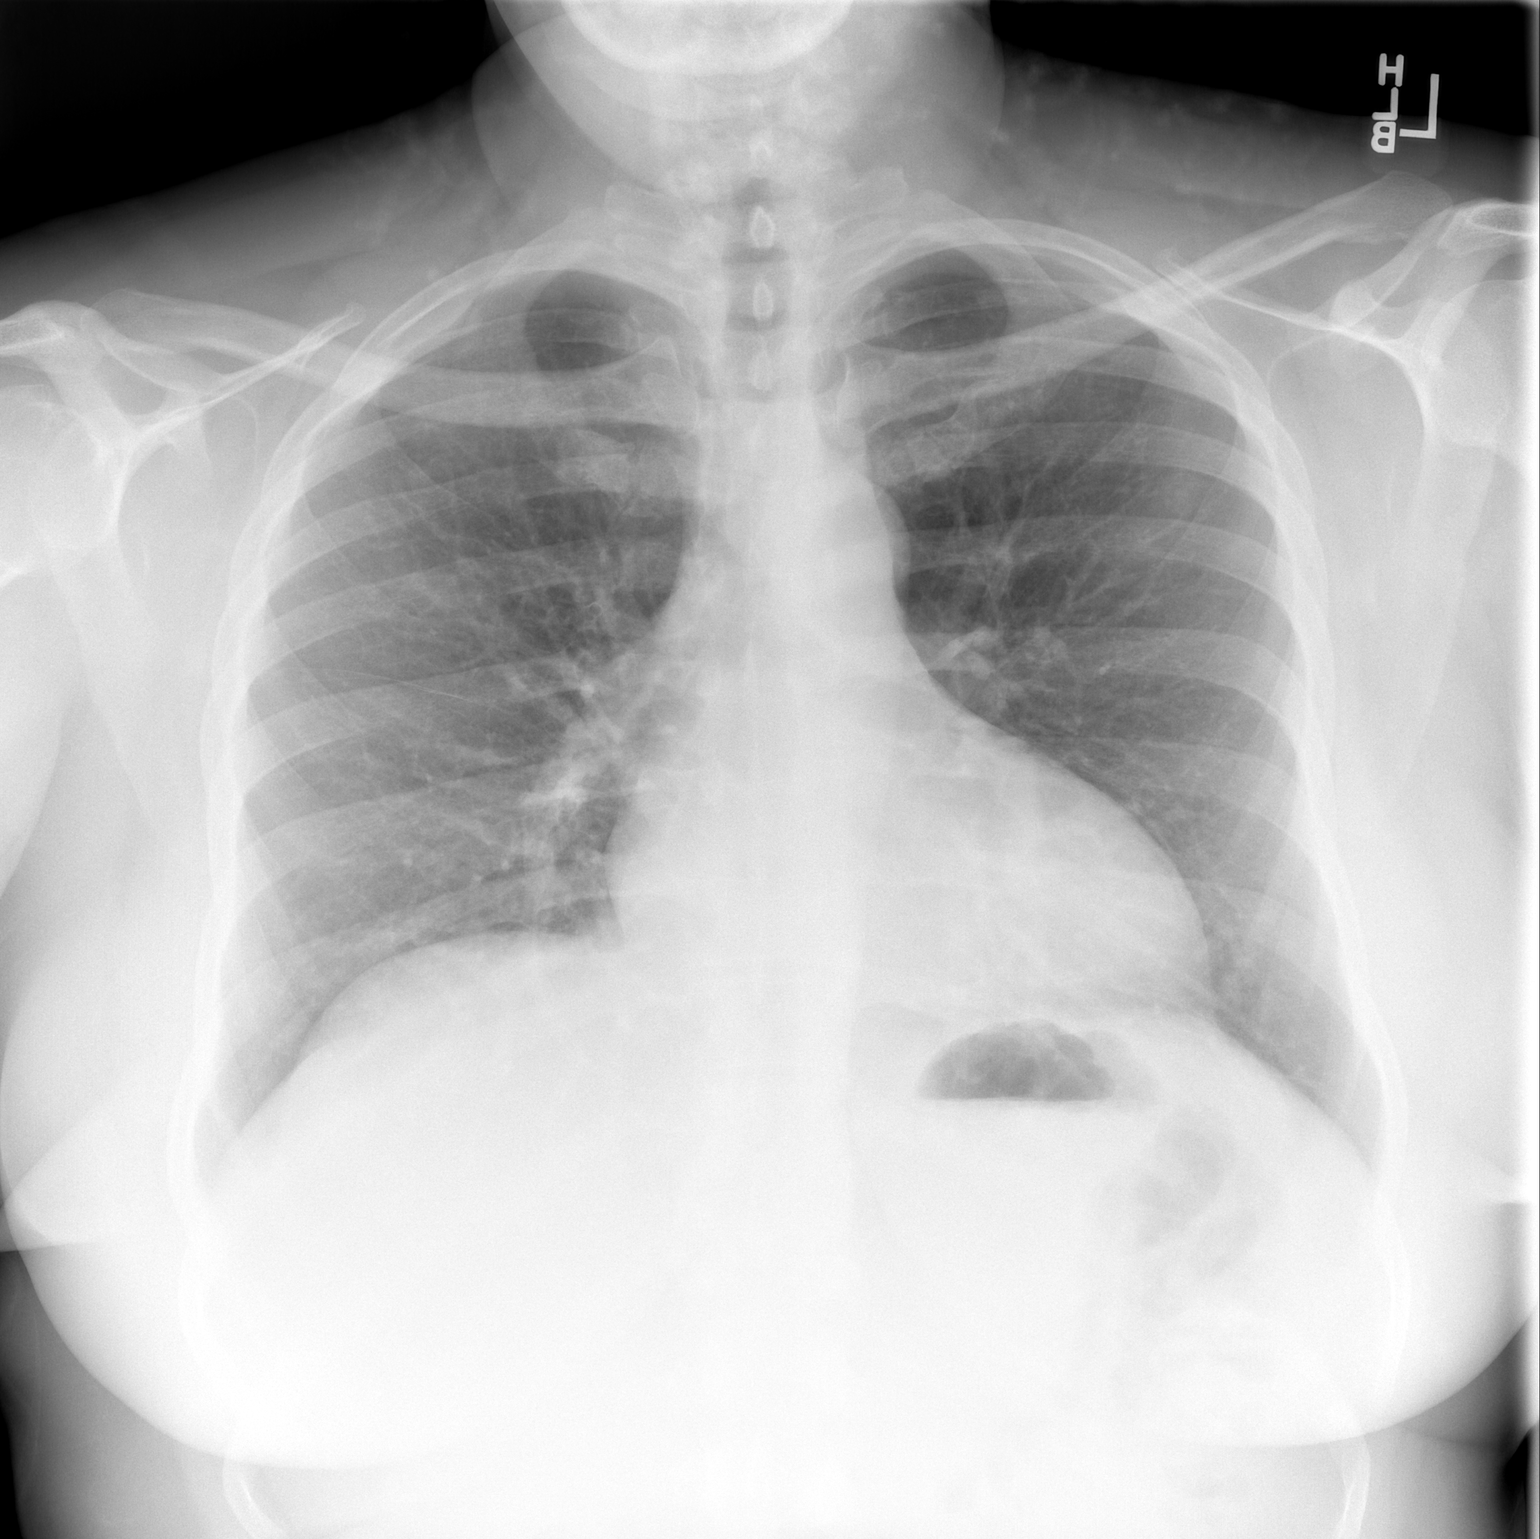

[w chest lat]
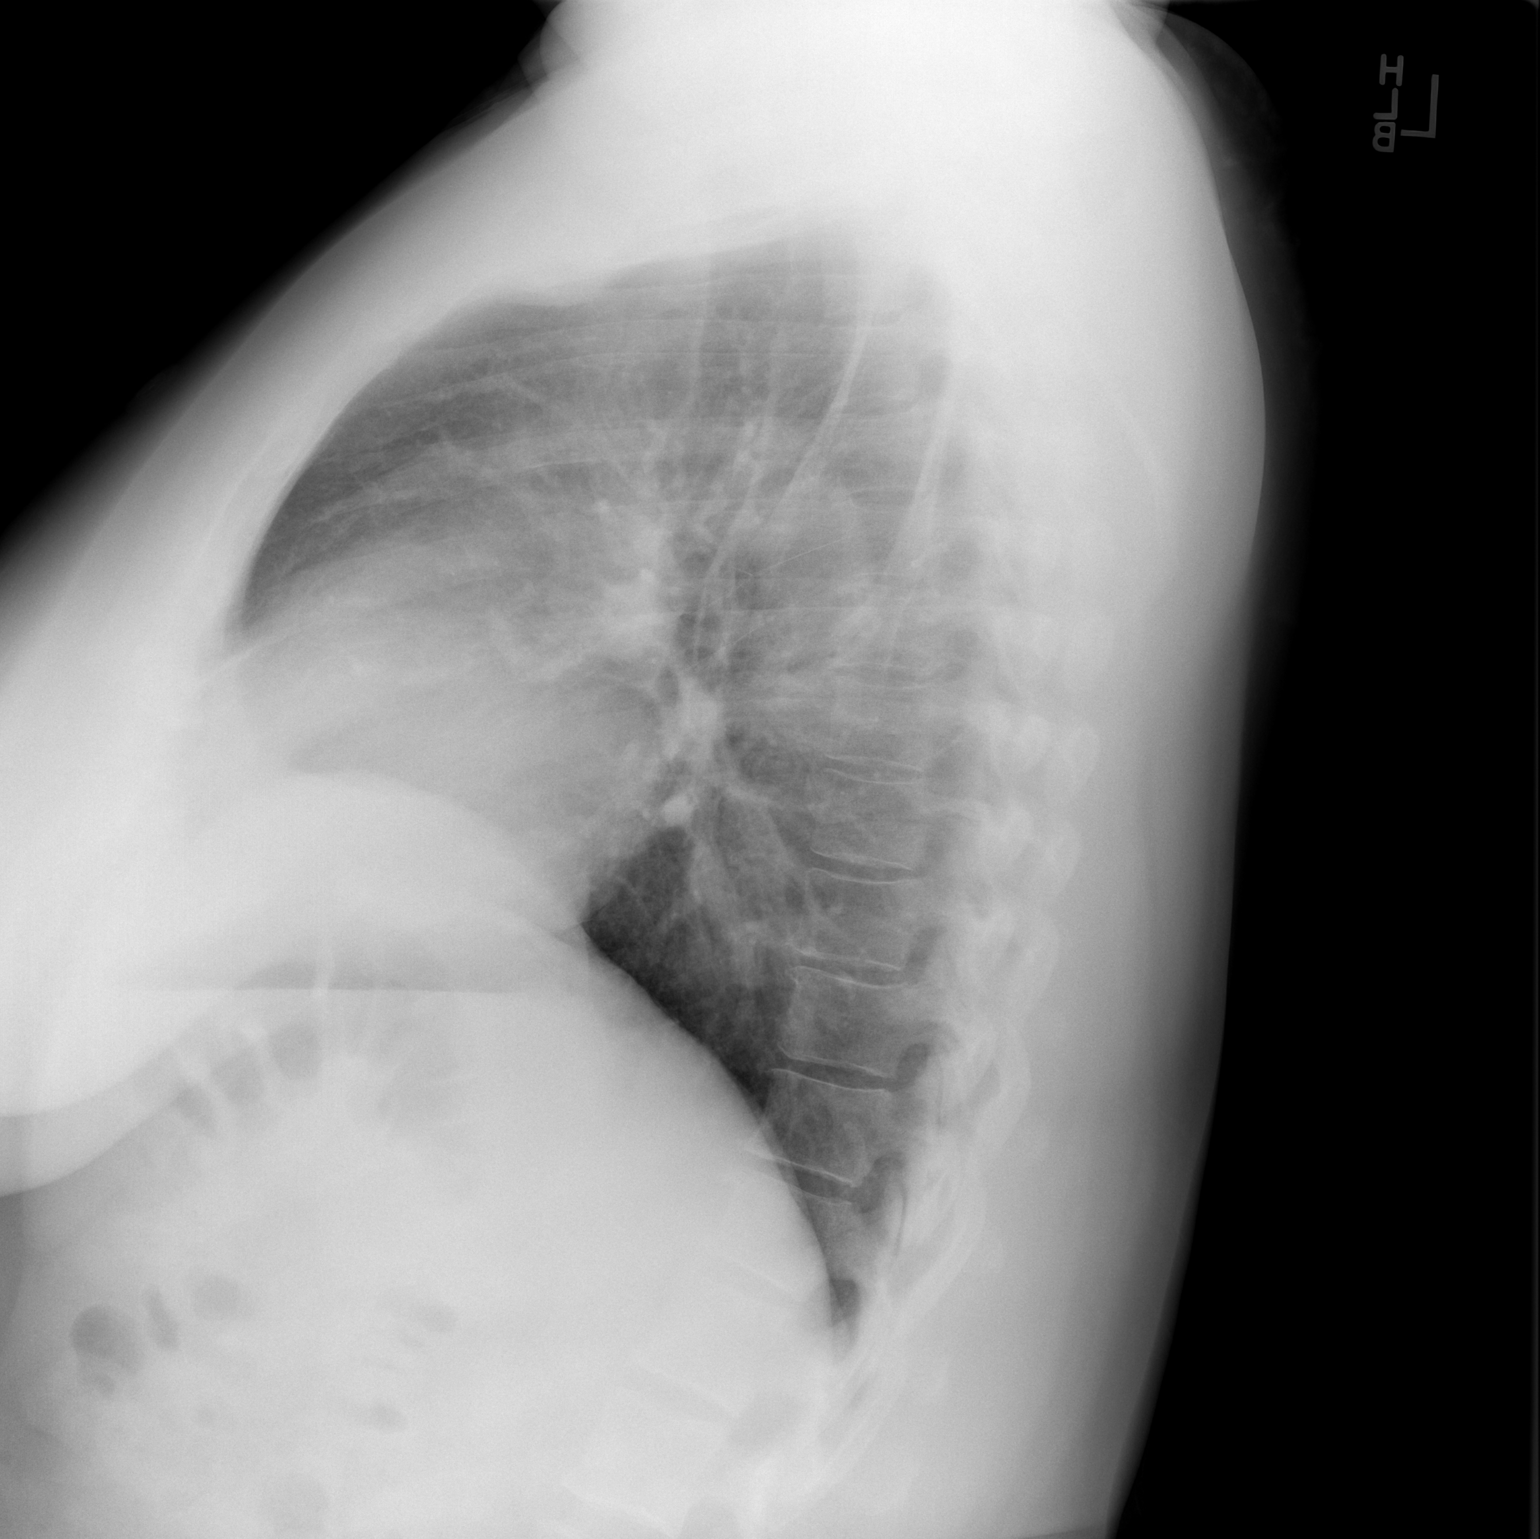

[2 of 2 positions shown; findings below may reference images not displayed]

FINDINGS: The heart size and mediastinal contours are within normal limits.
Both lungs are clear. The visualized skeletal structures are
unremarkable.
IMPRESSION: No active cardiopulmonary disease.

## 2023-10-16 ENCOUNTER — Encounter (HOSPITAL_BASED_OUTPATIENT_CLINIC_OR_DEPARTMENT_OTHER): Payer: Self-pay | Admitting: Emergency Medicine

## 2023-10-16 ENCOUNTER — Emergency Department (HOSPITAL_BASED_OUTPATIENT_CLINIC_OR_DEPARTMENT_OTHER): Admission: EM | Admit: 2023-10-16 | Discharge: 2023-10-16 | Disposition: A | Discharge status: Home / Self Care

## 2023-10-16 ENCOUNTER — Emergency Department (HOSPITAL_BASED_OUTPATIENT_CLINIC_OR_DEPARTMENT_OTHER)

## 2023-10-16 ENCOUNTER — Other Ambulatory Visit: Payer: Self-pay

## 2023-10-16 DIAGNOSIS — R0789 Other chest pain: Secondary | ICD-10-CM | POA: Diagnosis present

## 2023-10-16 LAB — BASIC METABOLIC PANEL WITH GFR
Anion gap: 12 (ref 5–15)
BUN: 13 mg/dL (ref 6–20)
CO2: 22 mmol/L (ref 22–32)
Calcium: 9.3 mg/dL (ref 8.9–10.3)
Chloride: 104 mmol/L (ref 98–111)
Creatinine, Ser: 0.93 mg/dL (ref 0.44–1.00)
GFR, Estimated: >60 mL/min (ref >60)
Glucose, Bld: 117 mg/dL — ABNORMAL HIGH (ref 70–99)
Potassium: 3.7 mmol/L (ref 3.5–5.1)
Sodium: 137 mmol/L (ref 135–145)

## 2023-10-16 LAB — TROPONIN T, HIGH SENSITIVITY
Troponin T High Sensitivity: <15 ng/L (ref <19)
Troponin T High Sensitivity: <15 ng/L (ref <19)

## 2023-10-16 LAB — CBC
HCT: 40.5 % (ref 36.0–46.0)
Hemoglobin: 13.2 g/dL (ref 12.0–15.0)
MCH: 27.1 pg (ref 26.0–34.0)
MCHC: 32.6 g/dL (ref 30.0–36.0)
MCV: 83.2 fL (ref 80.0–100.0)
Platelets: 230 K/uL (ref 150–400)
RBC: 4.87 MIL/uL (ref 3.87–5.11)
RDW: 12.7 % (ref 11.5–15.5)
WBC: 4.9 K/uL (ref 4.0–10.5)
nRBC: 0 % (ref 0.0–0.2)

## 2023-10-16 LAB — PREGNANCY, URINE: Preg Test, Ur: NEGATIVE

## 2023-10-16 NOTE — ED Triage Notes (Signed)
 Pt POV steadu gait- c/o central chest pain since yesterday, emesis x1 this AM. Reports pain worse with breathing.   Denies fever, known sick contact.

## 2023-10-16 NOTE — ED Provider Notes (Signed)
 [place] [facility] Provider Note   CSN: [id number] Arrival date & time: 10/16/23  1258     Patient presents with: Chest Pain   Karen Cain is a 48 y.o. female with no significant past medical history who presents for evaluation of central chest pain which she describes as sharp, intermittent, fleeting, occasionally occurs when she takes a deep breath.  She has been under significant stress lately.  She denies any exertional dyspnea, shortness of breath, chest pressure or syncope.  She has a little bit of nausea earlier today when the symptoms reoccurred.  She denies unilateral leg swelling, history of cancer, use of exogenous estrogens, history of DVT or PE, recent surgery or trauma.   The history is provided by the patient.  Chest Pain Chest pain location: central chest. Pain quality: sharp   Pain radiates to:  Does not radiate Pain severity:  Moderate Onset quality:  Gradual Timing:  Intermittent Progression:  Unchanged Chronicity:  New Context: stress   Context: not drug use, not eating, not lifting and not movement   Relieved by:  Nothing Worsened by:  Deep breathing Ineffective treatments:  None tried Associated symptoms: nausea   Risk factors: obesity   Risk factors: no birth control, no coronary artery disease, no diabetes mellitus, no high cholesterol, no hypertension and no smoking        Prior to Admission medications   Medication Sig Start Date End Date Taking? Authorizing Provider  cyclobenzaprine  (FLEXERIL ) 10 MG tablet Take 1 tablet (10 mg total) by mouth 2 (two) times daily as needed for muscle spasms. 08/25/19   Tegeler, Lonni PARAS, MD  diphenhydrAMINE  (BENADRYL ) 25 MG tablet Take 1 tablet (25 mg total) by mouth every 6 (six) hours as needed. 10/25/19   Dreama Longs, MD  gabapentin (NEURONTIN) 100 MG capsule Take 100 mg by mouth at bedtime. 09/28/19   [provider]  hydrochlorothiazide (HYDRODIURIL) 25 MG  tablet Take 25 mg by mouth daily.    [provider]  lidocaine  (LIDODERM ) 5 % Place 1 patch onto the skin daily. Remove & Discard patch within 12 hours or as directed by MD 08/25/19   Tegeler, Lonni PARAS, MD  ondansetron  (ZOFRAN  ODT) 4 MG disintegrating tablet Take 1 tablet (4 mg total) by mouth every 8 (eight) hours as needed for nausea or vomiting. 10/13/19   Neldon Hamp RAMAN, PA  prochlorperazine  (COMPAZINE ) 10 MG tablet Take 1 tablet (10 mg total) by mouth 2 (two) times daily as needed for nausea or vomiting. 10/25/19   Dreama Longs, MD    Allergies: Patient has no known allergies.    Review of Systems  Cardiovascular:  Positive for chest pain.  Gastrointestinal:  Positive for nausea.    Updated Vital Signs BP (!) 152/118 (BP Location: Right Arm)   Pulse 74   Temp 97.8 F (36.6 C)   Resp 18   Ht 5' 11 (1.803 m)   Wt 105.2 kg   SpO2 96%   BMI 32.36 kg/m   Physical Exam Vitals and nursing note reviewed.  Constitutional:      General: She is not in acute distress.    Appearance: She is well-developed. She is not diaphoretic.  HENT:     Head: Normocephalic and atraumatic.     Right Ear: External ear normal.     Left Ear: External ear normal.     Nose: Nose normal.     Mouth/Throat:     Mouth: Mucous membranes  are moist.  Eyes:     General: No scleral icterus.    Conjunctiva/sclera: Conjunctivae normal.  Cardiovascular:     Rate and Rhythm: Normal rate and regular rhythm.     Heart sounds: Normal heart sounds. No murmur heard.    No friction rub. No gallop.  Pulmonary:     Effort: Pulmonary effort is normal. No respiratory distress.     Breath sounds: Normal breath sounds.  Chest:     Chest wall: Tenderness present.       Comments: Reproducible chest wall pain which feels the same as her pain of complaint Abdominal:     General: Bowel sounds are normal. There is no distension.     Palpations: Abdomen is soft. There is no mass.     Tenderness: There  is no abdominal tenderness. There is no guarding.  Musculoskeletal:     Cervical back: Normal range of motion.  Skin:    General: Skin is warm and dry.  Neurological:     Mental Status: She is alert and oriented to person, place, and time.  Psychiatric:        Mood and Affect: Mood is anxious.        Behavior: Behavior normal.     (all labs ordered are listed, but only abnormal results are displayed) Labs Reviewed  CBC  PREGNANCY, URINE  BASIC METABOLIC PANEL WITH GFR  TROPONIN T, HIGH SENSITIVITY    EKG: EKG Interpretation Date/Time:  Friday October 16 2023 13:04:40 EDT Ventricular Rate:  87 PR Interval:  166 QRS Duration:  91 QT Interval:  360 QTC Calculation: 433 R Axis:   17  Text Interpretation: Sinus rhythm Left ventricular hypertrophy Inferior infarct, old Confirmed by Neysa Clap [phone]) on 10/16/2023 2:34:05 PM  Radiology: DG Chest 2 View Result Date: 10/16/2023 CLINICAL DATA:  Central chest pain EXAM: CHEST - 2 VIEW COMPARISON:  Chest radiograph dated 08/21/2022 FINDINGS: Normal lung volumes. No focal consolidations. No pleural effusion or pneumothorax. The heart size and mediastinal contours are within normal limits. No acute osseous abnormality. IMPRESSION: No active cardiopulmonary disease. Electronically Signed   By: Limin  Xu M.D.   On: 10/16/2023 14:05     Procedures   Medications Ordered in the ED - No data to display                                  Medical Decision Making Given the large differential diagnosis for Karen Cain, the decision making in this case is of high complexity.  After evaluating all of the data points in this case, the presentation of Karen Cain is NOT consistent with Acute Coronary Syndrome (ACS) and/or myocardial ischemia, pulmonary embolism, aortic dissection; Borhaave's, significant arrythmia, pneumothorax, cardiac tamponade, or other emergent cardiopulmonary condition.  Further, the presentation of Karen Cain  is NOT consistent with pericarditis, myocarditis, cholecystitis, pancreatitis, mediastinitis, endocarditis, new valvular disease.  Additionally, the presentation of Karen Cain NOT consistent with flail chest, cardiac contusion, ARDS, or significant intra-thoracic or intra-abdominal bleeding.  Moreover, this presentation is NOT consistent with pneumonia, sepsis, or pyelonephritis.  The patient has a HEART Score: 3   PERC negative   Strict return and follow-up precautions have been given by me personally or by detailed written instruction given verbally by nursing staff using the teach back method to the patient/family/caregiver(s).  Data Reviewed/Counseling: I have reviewed the patient's vital signs, nursing notes, and other relevant tests/information.  I had a detailed discussion regarding the historical points, exam findings, and any diagnostic results supporting the discharge diagnosis. I also discussed the need for outpatient follow-up and the need to return to the ED if symptoms worsen or if there are any questions or concerns that arise at home.    Amount and/or Complexity of Data Reviewed Labs: ordered.    Details: Troponins negative x 2, otherwise mildly elevated blood glucose of insignificant value Radiology: ordered and independent interpretation performed.    Details: I visualized and interpreted two-view chest x-ray which shows no acute finding ECG/medicine tests: ordered and independent interpretation performed.    Details: EKG shows sinus rhythm with LVH unchanged from previous tracing        Final diagnoses:  Chest wall pain    ED Discharge Orders     None          Arloa Chroman, PA-C 10/16/23 1730    Doretha Folks, MD 10/17/23 1321

## 2023-10-16 NOTE — ED Notes (Signed)

## 2023-10-16 NOTE — ED Notes (Signed)
 Anxious and having chest pain with nausea x2 days. Started last night, has recent family loss in January. Found FIL's body in home, and his birthday was 2 days prior, retriggering some emotions about the death. No sweating at present, full ROM in all extremities. No hx of clots, MI, or COPD.

## 2023-10-16 NOTE — Discharge Instructions (Signed)
###   Chest Wall Pain Discharge     **Instructions After Your Emergency Room Visit for Chest Wall Pain**      You came to the emergency room with chest pain. After a careful evaluation--including blood tests (troponins), an electrocardiogram (ECG), a chest X-ray, and other lab work--no signs of a heart attack, blood clot in the lungs, or other life-threatening causes were found. Your pain was found to be reproducible (meaning it could be brought on by pressing on your chest), which is often a sign of chest wall pain, such as muscle strain or inflammation.      **What Does This Mean?**      - Your tests show a very low risk of a serious heart problem at this time. Most people with your results do not have a heart attack or other major heart event in the next month.[1][2][3][4]      - Your chest pain is likely due to the muscles, bones, or joints in your chest wall, not your heart or lungs.      **What Should You Do Next?**      - **Follow up with your primary care provider (PCP)** within the next 1-2 weeks, or sooner if you do not have one. This is important to make sure you continue to do well and to discuss any ongoing symptoms.[1]      - If your pain is from muscle strain or inflammation, over-the-counter pain relievers like acetaminophen (Tylenol) or ibuprofen (Advil, Motrin) may help. Use these as directed on the package, unless you have been told not to take them for other health reasons.      - Rest the area as needed, and avoid activities that make the pain worse until you feel better.      **When Should You Return to the Emergency Room?**      Go back to the ER or call 911 if you have:      - New or worsening chest pain, especially if it is severe, lasts more than a few minutes, or comes with sweating, nausea, trouble breathing, or fainting.      - Chest pain that feels different from what brought you in today.      - Trouble breathing, coughing up blood, or severe weakness.       - Any other symptoms that worry you.      **Other Important Information**      - Most chest wall pain gets better with time and simple treatments.      - If you have questions or your pain does not improve, contact your primary care provider.      **Summary**      Your emergency room visit and tests show a very low risk of a serious heart or lung problem. Follow up with your primary care provider, and return to the ER if you have any new or concerning symptoms.[1][2][3][4]

## 2024-03-28 ENCOUNTER — Other Ambulatory Visit (HOSPITAL_BASED_OUTPATIENT_CLINIC_OR_DEPARTMENT_OTHER): Payer: Self-pay

## 2024-03-28 ENCOUNTER — Encounter (HOSPITAL_BASED_OUTPATIENT_CLINIC_OR_DEPARTMENT_OTHER): Payer: Self-pay

## 2024-03-28 ENCOUNTER — Emergency Department (HOSPITAL_BASED_OUTPATIENT_CLINIC_OR_DEPARTMENT_OTHER)

## 2024-03-28 ENCOUNTER — Emergency Department (HOSPITAL_BASED_OUTPATIENT_CLINIC_OR_DEPARTMENT_OTHER): Admission: EM | Admit: 2024-03-28 | Discharge: 2024-03-28 | Disposition: A | Discharge status: Home / Self Care

## 2024-03-28 ENCOUNTER — Other Ambulatory Visit: Payer: Self-pay

## 2024-03-28 DIAGNOSIS — I10 Essential (primary) hypertension: Secondary | ICD-10-CM | POA: Diagnosis not present

## 2024-03-28 DIAGNOSIS — J45909 Unspecified asthma, uncomplicated: Secondary | ICD-10-CM | POA: Diagnosis not present

## 2024-03-28 DIAGNOSIS — J069 Acute upper respiratory infection, unspecified: Secondary | ICD-10-CM | POA: Diagnosis not present

## 2024-03-28 DIAGNOSIS — E119 Type 2 diabetes mellitus without complications: Secondary | ICD-10-CM | POA: Insufficient documentation

## 2024-03-28 DIAGNOSIS — Z79899 Other long term (current) drug therapy: Secondary | ICD-10-CM | POA: Diagnosis not present

## 2024-03-28 DIAGNOSIS — R509 Fever, unspecified: Secondary | ICD-10-CM | POA: Diagnosis present

## 2024-03-28 LAB — BASIC METABOLIC PANEL WITH GFR
Anion gap: 11 (ref 5–15)
BUN: 12 mg/dL (ref 6–20)
CO2: 23 mmol/L (ref 22–32)
Calcium: 8.9 mg/dL (ref 8.9–10.3)
Chloride: 107 mmol/L (ref 98–111)
Creatinine, Ser: 0.86 mg/dL (ref 0.44–1.00)
GFR, Estimated: >60 mL/min
Glucose, Bld: 90 mg/dL (ref 70–99)
Potassium: 3.8 mmol/L (ref 3.5–5.1)
Sodium: 140 mmol/L (ref 135–145)

## 2024-03-28 LAB — CBC
HCT: 37.2 % (ref 36.0–46.0)
Hemoglobin: 12.1 g/dL (ref 12.0–15.0)
MCH: 27.4 pg (ref 26.0–34.0)
MCHC: 32.5 g/dL (ref 30.0–36.0)
MCV: 84.2 fL (ref 80.0–100.0)
Platelets: 200 K/uL (ref 150–400)
RBC: 4.42 MIL/uL (ref 3.87–5.11)
RDW: 12.6 % (ref 11.5–15.5)
WBC: 5.2 K/uL (ref 4.0–10.5)
nRBC: 0 % (ref 0.0–0.2)

## 2024-03-28 LAB — TROPONIN T, HIGH SENSITIVITY
Troponin T High Sensitivity: <15 ng/L (ref 0–19)
Troponin T High Sensitivity: <15 ng/L (ref 0–19)

## 2024-03-28 MED ORDER — PREDNISONE 10 MG PO TABS
40.0000 mg | ORAL_TABLET | Freq: Every day | ORAL | 0 refills | Status: AC
  Filled 2024-03-28: qty 15, 3d supply, fill #0

## 2024-03-28 MED ORDER — KETOROLAC TROMETHAMINE 15 MG/ML IJ SOLN
15.0000 mg | Freq: Once | INTRAMUSCULAR | Status: AC
  Administered 2024-03-28: 15 mg via INTRAVENOUS
  Filled 2024-03-28: qty 1

## 2024-03-28 MED ORDER — IPRATROPIUM-ALBUTEROL 0.5-2.5 (3) MG/3ML IN SOLN
3.0000 mL | Freq: Once | RESPIRATORY_TRACT | Status: AC
  Administered 2024-03-28: 3 mL via RESPIRATORY_TRACT
  Filled 2024-03-28: qty 3

## 2024-03-28 MED ORDER — ALBUTEROL SULFATE HFA 108 (90 BASE) MCG/ACT IN AERS
2.0000 | INHALATION_SPRAY | Freq: Once | RESPIRATORY_TRACT | Status: AC
  Administered 2024-03-28: 2 via RESPIRATORY_TRACT
  Filled 2024-03-28: qty 6.7

## 2024-03-28 NOTE — Discharge Instructions (Signed)
 Please take the inhaler every 4-6 hours as needed for shortness of breath.  We are prescribing you steroids.  Take them as prescribed.  Otherwise take Tylenol alternate with ibuprofen for your generalized malaise and upper respiratory

## 2024-03-28 NOTE — ED Notes (Signed)
 Cardiac monitor alarming. Cardiac strip submitted.

## 2024-03-28 NOTE — ED Triage Notes (Signed)
 Pt reports that she is having some chest tightness, congestion, runny nose and fever. States that she feels yucky. States that she has history of asthma. Doesn't have an inhaler to use at this time. States that it hurts to take a deep breath.

## 2024-03-28 NOTE — ED Notes (Signed)
 D/c paperwork reviewed with pt, including prescriptions and follow up care.  All questions and/or concerns addressed at time of d/c.  No further needs expressed. . Pt verbalized understanding, Ambulatory without assistance to ED exit, NAD.

## 2024-03-28 NOTE — ED Provider Notes (Signed)
 " [place] [facility] Provider Note   CSN: [id number] Arrival date & time: 03/28/24  1108     Patient presents with: Chest Pain   Karen Cain is a 49 y.o. female.   49 year old female presenting emergency department with URI symptoms and shortness of breath since Saturday.  Reports, fevers chills body aches generalized malaise.  Does have a history of asthma, is out of her inhaler.  Reports chest tightness.  Nausea vomiting.  Did have some diarrhea.   Chest Pain      Prior to Admission medications  Medication Sig Start Date End Date Taking? Authorizing Provider  amLODipine (NORVASC) 10 MG tablet Take 10 mg by mouth daily. 01/11/24  Yes [provider]  celecoxib (CELEBREX) 200 MG capsule Take 200 mg by mouth daily as needed. 03/02/24  Yes [provider]  glyBURIDE (DIABETA) 2.5 MG tablet Take 2.5 mg by mouth daily with breakfast. 05/14/21  Yes [provider]  lisinopril (ZESTRIL) 5 MG tablet Take 10 mg by mouth daily. 02/24/24  Yes [provider]  predniSONE  (DELTASONE ) 10 MG tablet Take 4 tablets (40 mg total) by mouth daily for 4 days. 03/28/24 04/01/24 Yes Yanky Vanderburg, Caron PARAS, DO  cyclobenzaprine  (FLEXERIL ) 10 MG tablet Take 1 tablet (10 mg total) by mouth 2 (two) times daily as needed for muscle spasms. 08/25/19   Tegeler, Lonni PARAS, MD  diphenhydrAMINE  (BENADRYL ) 25 MG tablet Take 1 tablet (25 mg total) by mouth every 6 (six) hours as needed. 10/25/19   Dreama Longs, MD  gabapentin (NEURONTIN) 100 MG capsule Take 100 mg by mouth at bedtime. 09/28/19   [provider]  hydrochlorothiazide (HYDRODIURIL) 25 MG tablet Take 25 mg by mouth daily.    [provider]  lidocaine  (LIDODERM ) 5 % Place 1 patch onto the skin daily. Remove & Discard patch within 12 hours or as directed by MD 08/25/19   Tegeler, Lonni PARAS, MD  ondansetron  (ZOFRAN  ODT) 4 MG disintegrating tablet Take 1 tablet (4 mg total)  by mouth every 8 (eight) hours as needed for nausea or vomiting. 10/13/19   Neldon Hamp RAMAN, PA  prochlorperazine  (COMPAZINE ) 10 MG tablet Take 1 tablet (10 mg total) by mouth 2 (two) times daily as needed for nausea or vomiting. 10/25/19   Dreama Longs, MD    Allergies: Patient has no known allergies.    Review of Systems  Cardiovascular:  Positive for chest pain.    Updated Vital Signs BP (!) 149/86   Pulse 79   Temp 99 F (37.2 C) (Oral)   Resp 13   Ht 5' 11 (1.803 m)   Wt 106.1 kg   LMP 03/18/2024 (Exact Date)   SpO2 100%   BMI 32.64 kg/m   Physical Exam Vitals and nursing note reviewed.  Constitutional:      General: She is not in acute distress.    Appearance: She is obese. She is not toxic-appearing.  HENT:     Head: Normocephalic.  Cardiovascular:     Rate and Rhythm: Normal rate and regular rhythm.     Pulses:          Radial pulses are 2+ on the right side and 2+ on the left side.  Pulmonary:     Effort: Pulmonary effort is normal.     Breath sounds: Decreased breath sounds present.  Abdominal:     Palpations: Abdomen is soft.  Musculoskeletal:     Right lower leg: No edema.  Left lower leg: No edema.  Skin:    General: Skin is warm.     Capillary Refill: Capillary refill takes less than 2 seconds.  Neurological:     General: No focal deficit present.     Mental Status: She is alert.  Psychiatric:        Mood and Affect: Mood normal.        Behavior: Behavior normal.     (all labs ordered are listed, but only abnormal results are displayed) Labs Reviewed  BASIC METABOLIC PANEL WITH GFR  CBC  TROPONIN T, HIGH SENSITIVITY  TROPONIN T, HIGH SENSITIVITY    EKG: EKG Interpretation Date/Time:  Monday March 28 2024 11:21:52 EST Ventricular Rate:  81 PR Interval:  180 QRS Duration:  86 QT Interval:  367 QTC Calculation: 426 R Axis:   19  Text Interpretation: Sinus rhythm Left ventricular hypertrophy Confirmed by Neysa Clap [phone])  on 03/28/2024 12:40:27 PM  Radiology: [facility] Chest 2 View Result Date: 03/28/2024 CLINICAL DATA:  Chest pain.  Fever.  Asthma. EXAM: CHEST - 2 VIEW COMPARISON:  10/16/2023 FINDINGS: The heart size and mediastinal contours are within normal limits. Both lungs are clear. The visualized skeletal structures are unremarkable. IMPRESSION: No active cardiopulmonary disease. Electronically Signed   By: Norleen DELENA Kil M.D.   On: 03/28/2024 12:37     Procedures   Medications Ordered in the ED  albuterol  (VENTOLIN  HFA) 108 (90 Base) MCG/ACT inhaler 2 puff (has no administration in time range)  ketorolac  (TORADOL ) 15 MG/ML injection 15 mg (15 mg Intravenous Given 03/28/24 1138)  ipratropium-albuterol  (DUONEB) 0.5-2.5 (3) MG/3ML nebulizer solution 3 mL (3 mLs Nebulization Given 03/28/24 1141)    Clinical Course as of 03/28/24 1502  Mon Mar 28, 2024  1227 Troponin T High Sensitivity: <15 ACS unlikely [TY]  1457 Troponin T High Sensitivity: <15 Repeat troponin negative.  Breathing has improved with breathing treatment.  Will discharge with albuterol  and short course of steroids.  Given strict return precautions. [TY]    Clinical Course User Index [TY] Neysa Clap PARAS, DO                                 Medical Decision Making This is a 49 year old female presenting emergency department with URI symptoms.  History of diabetes asthma and hypertension.  She is afebrile nontachycardic, slightly hypertensive.  Physical exam with some decreased air movement, but speaking full sentences and not respiratory distress.  EKG appears to be sinus rhythm on my independent review without ischemic changes.  Normal intervals.  No QTc prolongation.  Will get screening labs and chest x-ray.  Will treat with DuoNeb and Toradol  while awaiting workup.  See ED course for further MDM and final disposition.  Amount and/or Complexity of Data Reviewed External Data Reviewed:     Details: Several prior evaluations for similar chest pain  with negative workup Labs: ordered. Decision-making details documented in ED Course. Radiology: ordered and independent interpretation performed. Decision-making details documented in ED Course. ECG/medicine tests: independent interpretation performed. Decision-making details documented in ED Course.  Risk Prescription drug management. Decision regarding hospitalization. Diagnosis or treatment significantly limited by social determinants of health.       Final diagnoses:  Viral URI    ED Discharge Orders          Ordered    predniSONE  (DELTASONE ) 10 MG tablet  Daily  03/28/24 1458               Neysa Caron PARAS, DO 03/28/24 1502  "
